# Patient Record
Sex: Male | Born: 1960 | Hispanic: No | Marital: Married | Smoking: Current every day smoker
Health system: Southern US, Community
[De-identification: ages and names within clinical notes are randomized; demographics above are authoritative.]

## PROBLEM LIST (undated history)

## (undated) DIAGNOSIS — E119 Type 2 diabetes mellitus without complications: Secondary | ICD-10-CM

---

## 2016-07-30 ENCOUNTER — Other Ambulatory Visit: Payer: Self-pay | Admitting: Family Medicine

## 2016-07-30 ENCOUNTER — Ambulatory Visit
Admission: RE | Admit: 2016-07-30 | Discharge: 2016-07-30 | Disposition: A | Discharge status: Home / Self Care | Payer: BLUE CROSS/BLUE SHIELD | Source: Ambulatory Visit | Attending: Family Medicine | Admitting: Family Medicine

## 2016-07-30 DIAGNOSIS — R079 Chest pain, unspecified: Secondary | ICD-10-CM

## 2016-08-30 ENCOUNTER — Ambulatory Visit
Admission: RE | Admit: 2016-08-30 | Discharge: 2016-08-30 | Disposition: A | Discharge status: Home / Self Care | Payer: BLUE CROSS/BLUE SHIELD | Source: Ambulatory Visit | Attending: Family Medicine | Admitting: Family Medicine

## 2016-08-30 ENCOUNTER — Other Ambulatory Visit: Payer: Self-pay | Admitting: Family Medicine

## 2016-08-30 DIAGNOSIS — J9811 Atelectasis: Secondary | ICD-10-CM

## 2017-08-07 ENCOUNTER — Ambulatory Visit
Admission: RE | Admit: 2017-08-07 | Discharge: 2017-08-07 | Disposition: A | Discharge status: Home / Self Care | Payer: BLUE CROSS/BLUE SHIELD | Source: Ambulatory Visit | Attending: Family Medicine | Admitting: Family Medicine

## 2017-08-07 ENCOUNTER — Other Ambulatory Visit: Payer: Self-pay | Admitting: Family Medicine

## 2017-08-07 DIAGNOSIS — R05 Cough: Secondary | ICD-10-CM

## 2017-08-07 DIAGNOSIS — R079 Chest pain, unspecified: Secondary | ICD-10-CM

## 2017-08-07 DIAGNOSIS — R059 Cough, unspecified: Secondary | ICD-10-CM

## 2018-11-17 ENCOUNTER — Other Ambulatory Visit: Payer: Self-pay | Admitting: Optometry

## 2018-11-17 ENCOUNTER — Ambulatory Visit
Admission: RE | Admit: 2018-11-17 | Discharge: 2018-11-17 | Disposition: A | Discharge status: Home / Self Care | Payer: BLUE CROSS/BLUE SHIELD | Source: Ambulatory Visit | Attending: Optometry | Admitting: Optometry

## 2018-11-17 DIAGNOSIS — G5131 Clonic hemifacial spasm, right: Secondary | ICD-10-CM

## 2018-11-17 MED ORDER — GADOBENATE DIMEGLUMINE 529 MG/ML IV SOLN
12.0000 mL | Freq: Once | INTRAVENOUS | Status: AC | PRN
  Administered 2018-11-17: 12 mL via INTRAVENOUS

## 2020-03-16 ENCOUNTER — Ambulatory Visit: Payer: Self-pay | Attending: Internal Medicine

## 2020-03-22 ENCOUNTER — Emergency Department (HOSPITAL_COMMUNITY): Payer: 59

## 2020-03-22 ENCOUNTER — Inpatient Hospital Stay (HOSPITAL_COMMUNITY)
Admission: EM | Admit: 2020-03-22 | Discharge: 2020-03-28 | DRG: 177 | Disposition: A | Discharge status: Home / Self Care | Payer: 59 | Attending: Internal Medicine | Admitting: Internal Medicine

## 2020-03-22 ENCOUNTER — Encounter (HOSPITAL_COMMUNITY): Payer: Self-pay

## 2020-03-22 ENCOUNTER — Other Ambulatory Visit: Payer: Self-pay

## 2020-03-22 DIAGNOSIS — Y92230 Patient room in hospital as the place of occurrence of the external cause: Secondary | ICD-10-CM | POA: Diagnosis present

## 2020-03-22 DIAGNOSIS — R945 Abnormal results of liver function studies: Secondary | ICD-10-CM

## 2020-03-22 DIAGNOSIS — J1282 Pneumonia due to Coronavirus disease 2019: Secondary | ICD-10-CM

## 2020-03-22 DIAGNOSIS — T380X5A Adverse effect of glucocorticoids and synthetic analogues, initial encounter: Secondary | ICD-10-CM | POA: Diagnosis present

## 2020-03-22 DIAGNOSIS — R531 Weakness: Secondary | ICD-10-CM | POA: Diagnosis not present

## 2020-03-22 DIAGNOSIS — D539 Nutritional anemia, unspecified: Secondary | ICD-10-CM | POA: Diagnosis present

## 2020-03-22 DIAGNOSIS — E785 Hyperlipidemia, unspecified: Secondary | ICD-10-CM | POA: Diagnosis present

## 2020-03-22 DIAGNOSIS — R5383 Other fatigue: Secondary | ICD-10-CM | POA: Diagnosis not present

## 2020-03-22 DIAGNOSIS — F17200 Nicotine dependence, unspecified, uncomplicated: Secondary | ICD-10-CM | POA: Diagnosis present

## 2020-03-22 DIAGNOSIS — A0839 Other viral enteritis: Secondary | ICD-10-CM | POA: Diagnosis present

## 2020-03-22 DIAGNOSIS — E1169 Type 2 diabetes mellitus with other specified complication: Secondary | ICD-10-CM | POA: Diagnosis present

## 2020-03-22 DIAGNOSIS — Z7984 Long term (current) use of oral hypoglycemic drugs: Secondary | ICD-10-CM

## 2020-03-22 DIAGNOSIS — E878 Other disorders of electrolyte and fluid balance, not elsewhere classified: Secondary | ICD-10-CM

## 2020-03-22 DIAGNOSIS — E1165 Type 2 diabetes mellitus with hyperglycemia: Secondary | ICD-10-CM | POA: Diagnosis present

## 2020-03-22 DIAGNOSIS — R0600 Dyspnea, unspecified: Secondary | ICD-10-CM | POA: Diagnosis not present

## 2020-03-22 DIAGNOSIS — R197 Diarrhea, unspecified: Secondary | ICD-10-CM | POA: Diagnosis not present

## 2020-03-22 DIAGNOSIS — I959 Hypotension, unspecified: Secondary | ICD-10-CM | POA: Diagnosis not present

## 2020-03-22 DIAGNOSIS — Z79899 Other long term (current) drug therapy: Secondary | ICD-10-CM | POA: Diagnosis not present

## 2020-03-22 DIAGNOSIS — R638 Other symptoms and signs concerning food and fluid intake: Secondary | ICD-10-CM

## 2020-03-22 DIAGNOSIS — F1010 Alcohol abuse, uncomplicated: Secondary | ICD-10-CM | POA: Diagnosis present

## 2020-03-22 DIAGNOSIS — J069 Acute upper respiratory infection, unspecified: Secondary | ICD-10-CM | POA: Diagnosis not present

## 2020-03-22 DIAGNOSIS — E871 Hypo-osmolality and hyponatremia: Secondary | ICD-10-CM

## 2020-03-22 DIAGNOSIS — U071 COVID-19: Secondary | ICD-10-CM | POA: Diagnosis not present

## 2020-03-22 DIAGNOSIS — E876 Hypokalemia: Secondary | ICD-10-CM

## 2020-03-22 DIAGNOSIS — R0602 Shortness of breath: Secondary | ICD-10-CM

## 2020-03-22 DIAGNOSIS — E1142 Type 2 diabetes mellitus with diabetic polyneuropathy: Secondary | ICD-10-CM | POA: Diagnosis present

## 2020-03-22 DIAGNOSIS — J9601 Acute respiratory failure with hypoxia: Secondary | ICD-10-CM | POA: Diagnosis present

## 2020-03-22 DIAGNOSIS — R7989 Other specified abnormal findings of blood chemistry: Secondary | ICD-10-CM

## 2020-03-22 HISTORY — DX: Type 2 diabetes mellitus without complications: E11.9

## 2020-03-22 LAB — COMPREHENSIVE METABOLIC PANEL
ALT: 80 U/L — ABNORMAL HIGH (ref 0–44)
AST: 125 U/L — ABNORMAL HIGH (ref 15–41)
Albumin: 3.3 g/dL — ABNORMAL LOW (ref 3.5–5.0)
Alkaline Phosphatase: 70 U/L (ref 38–126)
Anion gap: 10 (ref 5–15)
BUN: 10 mg/dL (ref 6–20)
CO2: 28 mmol/L (ref 22–32)
Calcium: 8.1 mg/dL — ABNORMAL LOW (ref 8.9–10.3)
Chloride: 93 mmol/L — ABNORMAL LOW (ref 98–111)
Creatinine, Ser: 0.62 mg/dL (ref 0.61–1.24)
GFR calc Af Amer: >60 mL/min (ref >60)
GFR calc non Af Amer: >60 mL/min (ref >60)
Glucose, Bld: 230 mg/dL — ABNORMAL HIGH (ref 70–99)
Potassium: 3.3 mmol/L — ABNORMAL LOW (ref 3.5–5.1)
Sodium: 131 mmol/L — ABNORMAL LOW (ref 135–145)
Total Bilirubin: 1.1 mg/dL (ref 0.3–1.2)
Total Protein: 6.5 g/dL (ref 6.5–8.1)

## 2020-03-22 LAB — URINALYSIS, ROUTINE W REFLEX MICROSCOPIC
Bilirubin Urine: NEGATIVE
Glucose, UA: >=500 mg/dL — AB
Hgb urine dipstick: NEGATIVE
Ketones, ur: 80 mg/dL — AB
Leukocytes,Ua: NEGATIVE
Nitrite: NEGATIVE
Protein, ur: 100 mg/dL — AB
Specific Gravity, Urine: 1.014 (ref 1.005–1.030)
pH: 7 (ref 5.0–8.0)

## 2020-03-22 LAB — CBC WITH DIFFERENTIAL/PLATELET
Abs Immature Granulocytes: 0.02 10*3/uL (ref 0.00–0.07)
Basophils Absolute: 0 10*3/uL (ref 0.0–0.1)
Basophils Relative: 0 %
Eosinophils Absolute: 0 10*3/uL (ref 0.0–0.5)
Eosinophils Relative: 1 %
HCT: 35.1 % — ABNORMAL LOW (ref 39.0–52.0)
Hemoglobin: 12.5 g/dL — ABNORMAL LOW (ref 13.0–17.0)
Immature Granulocytes: 1 %
Lymphocytes Relative: 19 %
Lymphs Abs: 0.7 10*3/uL (ref 0.7–4.0)
MCH: 37.9 pg — ABNORMAL HIGH (ref 26.0–34.0)
MCHC: 35.6 g/dL (ref 30.0–36.0)
MCV: 106.4 fL — ABNORMAL HIGH (ref 80.0–100.0)
Monocytes Absolute: 0.6 10*3/uL (ref 0.1–1.0)
Monocytes Relative: 17 %
Neutro Abs: 2.4 10*3/uL (ref 1.7–7.7)
Neutrophils Relative %: 62 %
Platelets: 227 10*3/uL (ref 150–400)
RBC: 3.3 MIL/uL — ABNORMAL LOW (ref 4.22–5.81)
RDW: 11.5 % (ref 11.5–15.5)
WBC: 3.7 10*3/uL — ABNORMAL LOW (ref 4.0–10.5)
nRBC: 0 % (ref 0.0–0.2)

## 2020-03-22 LAB — D-DIMER, QUANTITATIVE: D-Dimer, Quant: 1.22 ug/mL-FEU — ABNORMAL HIGH (ref 0.00–0.50)

## 2020-03-22 LAB — FIBRINOGEN: Fibrinogen: 596 mg/dL — ABNORMAL HIGH (ref 210–475)

## 2020-03-22 LAB — MAGNESIUM: Magnesium: 2 mg/dL (ref 1.7–2.4)

## 2020-03-22 LAB — PROCALCITONIN: Procalcitonin: <0.1 ng/mL

## 2020-03-22 LAB — RESPIRATORY PANEL BY RT PCR (FLU A&B, COVID)
Influenza A by PCR: NEGATIVE
Influenza B by PCR: NEGATIVE
SARS Coronavirus 2 by RT PCR: POSITIVE — AB

## 2020-03-22 LAB — TRIGLYCERIDES: Triglycerides: 145 mg/dL (ref <150)

## 2020-03-22 LAB — LACTIC ACID, PLASMA: Lactic Acid, Venous: 1.5 mmol/L (ref 0.5–1.9)

## 2020-03-22 LAB — TROPONIN I (HIGH SENSITIVITY): Troponin I (High Sensitivity): 3 ng/L (ref <18)

## 2020-03-22 LAB — LACTATE DEHYDROGENASE: LDH: 716 U/L — ABNORMAL HIGH (ref 98–192)

## 2020-03-22 LAB — ETHANOL: Alcohol, Ethyl (B): <10 mg/dL (ref <10)

## 2020-03-22 LAB — C-REACTIVE PROTEIN: CRP: 3.1 mg/dL — ABNORMAL HIGH (ref <1.0)

## 2020-03-22 LAB — FERRITIN: Ferritin: >7500 ng/mL — ABNORMAL HIGH (ref 24–336)

## 2020-03-22 MED ORDER — POTASSIUM CHLORIDE CRYS ER 20 MEQ PO TBCR
40.0000 meq | EXTENDED_RELEASE_TABLET | Freq: Two times a day (BID) | ORAL | Status: AC
  Administered 2020-03-22 – 2020-03-23 (×2): 40 meq via ORAL
  Filled 2020-03-22 (×2): qty 2

## 2020-03-22 MED ORDER — THIAMINE HCL 100 MG PO TABS
100.0000 mg | ORAL_TABLET | Freq: Every day | ORAL | Status: DC
  Administered 2020-03-23 – 2020-03-28 (×6): 100 mg via ORAL
  Filled 2020-03-22 (×6): qty 1

## 2020-03-22 MED ORDER — SODIUM CHLORIDE 0.9 % IV BOLUS
500.0000 mL | Freq: Once | INTRAVENOUS | Status: AC
  Administered 2020-03-22: 500 mL via INTRAVENOUS

## 2020-03-22 MED ORDER — ADULT MULTIVITAMIN W/MINERALS CH
1.0000 | ORAL_TABLET | Freq: Every day | ORAL | Status: DC
  Administered 2020-03-22 – 2020-03-28 (×7): 1 via ORAL
  Filled 2020-03-22 (×7): qty 1

## 2020-03-22 MED ORDER — FOLIC ACID 1 MG PO TABS
1.0000 mg | ORAL_TABLET | Freq: Every day | ORAL | Status: DC
  Administered 2020-03-22 – 2020-03-28 (×7): 1 mg via ORAL
  Filled 2020-03-22 (×7): qty 1

## 2020-03-22 MED ORDER — LORAZEPAM 2 MG/ML IJ SOLN: 0.0000 mg | Freq: Two times a day (BID) | INTRAMUSCULAR | Status: DC

## 2020-03-22 MED ORDER — SODIUM CHLORIDE 0.9 % IV SOLN
100.0000 mg | INTRAVENOUS | Status: AC
  Administered 2020-03-22 (×2): 100 mg via INTRAVENOUS
  Filled 2020-03-22 (×2): qty 20

## 2020-03-22 MED ORDER — IPRATROPIUM-ALBUTEROL 20-100 MCG/ACT IN AERS
1.0000 | INHALATION_SPRAY | Freq: Four times a day (QID) | RESPIRATORY_TRACT | Status: DC
  Administered 2020-03-22 – 2020-03-28 (×23): 1 via RESPIRATORY_TRACT
  Filled 2020-03-22: qty 4

## 2020-03-22 MED ORDER — THIAMINE HCL 100 MG/ML IJ SOLN
100.0000 mg | Freq: Every day | INTRAMUSCULAR | Status: DC
  Administered 2020-03-22: 100 mg via INTRAVENOUS
  Filled 2020-03-22 (×2): qty 2

## 2020-03-22 MED ORDER — LORAZEPAM 1 MG PO TABS: 1.0000 mg | ORAL_TABLET | ORAL | Status: DC | PRN

## 2020-03-22 MED ORDER — LORAZEPAM 2 MG/ML IJ SOLN: 0.0000 mg | Freq: Four times a day (QID) | INTRAMUSCULAR | Status: DC

## 2020-03-22 MED ORDER — SODIUM CHLORIDE 0.9 % IV SOLN
100.0000 mg | Freq: Every day | INTRAVENOUS | Status: AC
  Administered 2020-03-23 – 2020-03-26 (×4): 100 mg via INTRAVENOUS
  Filled 2020-03-22 (×4): qty 20

## 2020-03-22 MED ORDER — DEXAMETHASONE SODIUM PHOSPHATE 10 MG/ML IJ SOLN
10.0000 mg | Freq: Once | INTRAMUSCULAR | Status: AC
  Administered 2020-03-22: 10 mg via INTRAVENOUS
  Filled 2020-03-22: qty 1

## 2020-03-22 MED ORDER — SODIUM CHLORIDE 0.9 % IV SOLN: INTRAVENOUS | Status: DC

## 2020-03-22 MED ORDER — LORAZEPAM 2 MG/ML IJ SOLN: 1.0000 mg | INTRAMUSCULAR | Status: DC | PRN

## 2020-03-22 NOTE — ED Triage Notes (Signed)
Pt states he had Covid + test 8 days ago, c/o worsening SOB and cough with fatigue and loss of appetite.

## 2020-03-22 NOTE — H&P (Signed)
History and Physical    David Garrett PTW:[id number] DOB: Jan 03, 1961 DOA: 03/22/2020  PCP: System, Pcp Not In   Patient coming from: Home  Chief Complaint: Worseneing SOB, Cough, Fatigue, Chest Pain with Coughing and Diarrhea  HPI: David Garrett is a 59 y.o. male with medical history significant for Diabetes Mellitus Type 2 complicated by diabetic polyneuropathy on oral medication, hyperlipidemia on statin, alcohol abuse with daily use, macrocytosis without anemia, as well as tobacco abuse and other comorbidities who presents with a chief complaint of fatigue, worsening shortness of breath and cough along with diarrhea.  Patient states that he is not been febrile as he has been taking Tylenol frequently.  He was diagnosed with COVID-19 disease 8 days ago and was doing okay until he acutely worsened over the last week.  He does have family context of Covid and his special needs daughter is currently hospitalized with COVID-19 disease.  He states that he has been having more frequent diarrhea and he has not had a very good appetite given that he recently had 7 teeth removed.  He has not had a very good appetite and states that he is just not been doing well since then and has gotten progressively worse.  ED Course: The ED he had basic blood work done and inflammatory markers were checked.  A chest x-ray was done and an EKG was done as well.  He was given 10 mg of IV Solu-Medrol blood cultures were also checked.  Repeat Covid testing is pending however he states he test + 8 days ago.  Review of Systems: As per HPI otherwise all other systems reviewed and negative.   Past Medical History:  Diagnosis Date  . Diabetes mellitus without complication ([facility])    SURICAL HISTORY  History reviewed.Has recently had several Teeth removed by Dr. Nonah Mattes for Chronic Apical Abscesses and Fillings  SOCIAL HISTORY   reports that he has been smoking. He has never used smokeless tobacco.  Patient drinks 4 cans of beer  daily  ALLERGIES No Known Allergies  FAMILY HISTORY  History reviewed.  His sister had lung cancer  Prior to Admission medications   Medication Sig Start Date End Date Taking? Authorizing Provider  chlorhexidine (PERIDEX) 0.12 % solution 15 mLs by Mouth Rinse route 2 (two) times daily. 02/23/20   [provider]  metFORMIN (GLUCOPHAGE-XR) 500 MG 24 hr tablet Take 1,000 mg by mouth every evening. 03/12/20   [provider]   Physical Exam: Vitals:   03/22/20 1715 03/22/20 1730 03/22/20 1745 03/22/20 1800  BP:  118/83  110/65  Pulse: 91 (!) 102 86 86  Resp: (!) 25   20  Temp:      TempSrc:      SpO2: 100% 92% 91% 91%   Constitutional: WN/WD male currently in mild respiratory distress appears mildly uncomfortable Eyes: Lids and conjunctivae normal, sclerae anicteric  ENMT: External Ears, Nose appear normal. Grossly normal hearing. Poor Dentition  Neck: Appears normal, supple, no cervical masses, normal ROM, no appreciable thyromegaly; no JVD Respiratory: Diminished to auscultation bilaterally with coarse breath sounds and some mild rhonchi; No appreciable wheezing, rales, crackles. Normal respiratory effort and patient is not tachypenic. No accessory muscle use. Wearing 2 liters of supplemental O2 via [place] Cardiovascular: RRR, no murmurs / rubs / gallops. S1 and S2 auscultated. No extremity edema. Abdomen: Soft, non-tender, non-distended. Bowel sounds positive.  GU: Deferred. Musculoskeletal: No clubbing / cyanosis of digits/nails. No joint deformity upper and lower extremities.  Skin: No rashes, lesions, ulcers on a limited skin evaluation. No induration; Warm and dry.  Neurologic: CN 2-12 grossly intact with no focal deficits. Romberg sign cerebellar reflexes not assessed.  Psychiatric: Normal judgment and insight. Alert and oriented x 3. Normal mood and appropriate affect.   Labs on Admission: I have personally reviewed following labs and imaging  studies  CBC: Recent Labs  Lab 03/22/20 1504  WBC 3.7*  NEUTROABS 2.4  HGB 12.5*  HCT 35.1*  MCV 106.4*  PLT 563   Basic Metabolic Panel: Recent Labs  Lab 03/22/20 1504  NA 131*  K 3.3*  CL 93*  CO2 28  GLUCOSE 230*  BUN 10  CREATININE 0.62  CALCIUM 8.1*   GFR: CrCl cannot be calculated (Unknown ideal weight.). Liver Function Tests: Recent Labs  Lab 03/22/20 1504  AST 125*  ALT 80*  ALKPHOS 70  BILITOT 1.1  PROT 6.5  ALBUMIN 3.3*   No results for input(s): LIPASE, AMYLASE in the last 168 hours. No results for input(s): AMMONIA in the last 168 hours. Coagulation Profile: No results for input(s): INR, PROTIME in the last 168 hours. Cardiac Enzymes: No results for input(s): CKTOTAL, CKMB, CKMBINDEX, TROPONINI in the last 168 hours. BNP (last 3 results) No results for input(s): PROBNP in the last 8760 hours. HbA1C: No results for input(s): HGBA1C in the last 72 hours. CBG: No results for input(s): GLUCAP in the last 168 hours. Lipid Profile: Recent Labs    03/22/20 1504  TRIG 145   Thyroid Function Tests: No results for input(s): TSH, T4TOTAL, FREET4, T3FREE, THYROIDAB in the last 72 hours. Anemia Panel: Recent Labs    03/22/20 1504  FERRITIN >7,500*   Urine analysis: No results found for: COLORURINE, APPEARANCEUR, LABSPEC, PHURINE, GLUCOSEU, HGBUR, BILIRUBINUR, KETONESUR, PROTEINUR, UROBILINOGEN, NITRITE, LEUKOCYTESUR Sepsis Labs: !!!!!!!!!!!!!!!!!!!!!!!!!!!!!!!!!!!!!!!!!!!! '@LABRCNTIP'$ (procalcitonin:4,lacticidven:4) )No results found for this or any previous visit (from the past 240 hour(s)).   Radiological Exams on Admission: DG Chest Port 1 View  Result Date: 03/22/2020 CLINICAL DATA:  Cough and possible positive COVID-19 test EXAM: PORTABLE CHEST 1 VIEW COMPARISON:  None. FINDINGS: Cardiac shadows within normal limits. Mild aortic calcifications are seen. The lungs are well aerated bilaterally with patchy bibasilar opacities. Some less  prominent opacities are noted particularly in the left upper lobe. No bony abnormality is noted. IMPRESSION: Patchy bilateral opacities predominately in the bases. Correlate with COVID-19 testing as this could represent atypical pneumonia. Electronically Signed   By: Inez Catalina M.D.   On: 03/22/2020 15:09    EKG: Independently reviewed.  Sinus rhythm with a rate of 85 and a QTC of 442 with no evidence of ST elevation on my interpretation  Assessment/Plan Active Problems:   Pneumonia due to COVID-19 virus   Gastroenteritis due to COVID-19 virus   Acute respiratory disease due to COVID-19 virus   Diarrhea   Hyponatremia   Hypochloremia   Hypokalemia   Abnormal LFTs   Macrocytic anemia   Type 2 diabetes mellitus with hyperlipidemia (HCC)   Decreased oral intake   Generalized weakness   Fatigue   Pneumonia secondary to COVID-19 disease present on admission Acute respiratory Disease secondary to COVID-19 disease Gastroenteritis secondary to COVID-19 disease -Patient presents with fatigue, worsening cough and shortness of breath along with some diarrhea is progressively gotten worse -He has had a poor p.o. intake secondary to his fatigue and generalized not feeling well and he has been having diarrhea as well -Patient states that he tested +8 days ago -Recently  had 7 teeth taken out for abscesses -Been around sick contacts that have COVID-19 disease and his daughter is actually hospitalized here with COVID-19 disease as well -Chest X-ray showed "Cardiac shadows within normal limits. Mild aortic calcifications are seen. The lungs are well aerated bilaterally with patchy bibasilar opacities. Some less prominent opacities are noted particularly in  the left upper lobe. No bony abnormality is noted." -Check baseline inflammatory markers and trend daily: Currently ESR still pending to be done: Recent Labs    03/22/20 1504  DDIMER 1.22*  FERRITIN >7,500*  LDH 716*  CRP 3.1*  -Fibrinogen  was 596 triglycerides were normal  No results found for: SARSCOV2NAA -SpO2: 91 % -Wearing 2 L of supplemental oxygen via nasal cannula due to has some respiratory distress and mildly low oxygen saturations in the low 90s -Start the patient on a remdesivir course for 5 days and also start the patient on dexamethasone 6 mg daily for 10 days -Encourage proning as much as possible -Continue supplemental oxygen via nasal cannula and wean O2 as tolerated -Continuous pulse oximetry and maintain O2 saturations greater than 90% -Start the patient on Combivent scheduled and albuterol inhaler as needed every 4h -Continue with antitussives with Tussionex and Robitussin-DM -Procalcitonin level assessment 0.10 and lactic acid level is 1.5-check blood cultures x200 pending -Continue with vitamin C and zinc -Airborne and droplet precautions -Repeat chest x-ray in the a.m. -We will need an ambulatory home O2 screen prior to discharge -Continue to monitor respiratory status carefully  Diarrhea -In the setting of above -Continue gentle IV fluid hydration for 1 day -Continue to monitor and continue with supportive care  Hyponatremia/hypochloremia -In the setting of poor p.o. intake and dehydration -Given a bolus of 500 mL in the ED and will start normal saline at 75 MLS per hour for 1 day and stop in accordance to not volume overload patient -Continue to monitor and trend repeat CMP in a.m.  Hypokalemia -Patient's potassium was 3.3 and likely in the setting of diarrhea  -check magnesium level -Replete with p.o. potassium chloride 40 mEq twice daily x2 doses -Continue monitor and replete as necessary -Repeat CMP in a.m.  Abnormal LFTs -Likely in the setting of his COVID-19 disease but he does have a history of alcohol abuse -AST is 125 on admission ALT was 80 next- -because ALT is not greater than 220 we can start with remdesivir -Continue to monitor and trend his LFTs and if not improving or  worsening will obtain a right upper quadrant ultrasound as well as acute hepatitis panel -Repeat CMP in a.m.  Macrocytic Anemia -In setting of alcohol abuse -Patient's hemoglobin/hematocrit was 12.5/35.1 and he carries a diagnosis of macrocytosis without anemia -Check anemia panel in the a.m. Continue to monitor for signs and symptoms of bleeding; currently no overt bleeding noted -Repeat CBC in a.m.  Alcohol abuse with concern for withdrawal -Initiate CIWA scale and withdrawal protocol with lorazepam -Continue folic acid, multivitamin, vitamin B12 -Continue to monitor for signs and symptoms of withdrawal patient drinks beer daily  Diabetes Mellitus Type II  -takes oral medications which will be held  -we will place on sensitive NovoLog sliding scale insulin before meals and at bedtime and will place the patient on a diabetic diet -Continue monitor and trend blood sugars and check hemoglobin A1c in the a.m.  Poor p.o. intake -In the setting of his teeth being removed as well as COVID-19 disease Consult nutritionist for further evaluation recommendations Continue with gentle IV fluid  hydration with normal saline today given his poor p.o. intake and placed on a carb modified diet  Generalized Weakness and fatigue  -in the setting of above -Check PT OT -Check TSH  -Continue to treat Covid as above  HLD -Hold Statin given elevated LFTs  DVT prophylaxis: Enoxaparin 40 mg subcu every 24 Code Status: FULL CODE Family Communication: No family present at bedside as patient's wife is with his daughter upstairs on [address] Disposition Plan: Pending further clinical improvement and treatment of his COVID-19 disease Consults called: None Admission status: Inpatient telemetry  Severity of Illness: The appropriate patient status for this patient is INPATIENT. Inpatient status is judged to be reasonable and necessary in order to provide the required intensity of service to ensure the patient's  safety. The patient's presenting symptoms, physical exam findings, and initial radiographic and laboratory data in the context of their chronic comorbidities is felt to place them at high risk for further clinical deterioration. Furthermore, it is not anticipated that the patient will be medically stable for discharge from the hospital within 2 midnights of admission. The following factors support the patient status of inpatient.   " The patient's presenting symptoms include cough, fatigue, worsening shortness of breath, diarrhea and poor po intake. " The worrisome physical exam findings include diminished lung sounds, cough and slightly dry mucous membranes . " The initial radiographic and laboratory data are worrisome because of Patchy bilateral Opacities and Elevated Inflammatory Markers . " The chronic co-morbidities include Diabetes, HLD, Alcohol Abuse, Dental Abscesses  * I certify that at the point of admission it is my clinical judgment that the patient will require inpatient hospital care spanning beyond 2 midnights from the point of admission due to high intensity of service, high risk for further deterioration and high frequency of surveillance required.Kerney Elbe, D.O. [facility] is on [place]  If 7PM-7AM, please contact night-coverage [email or web address]  03/22/2020, 6:07 PM

## 2020-03-22 NOTE — ED Provider Notes (Signed)
Care assumed from Dr. Rodena Medin at shift change.  Patient is a 59 year old male diagnosed with COVID-19 8 days ago.  He presents today with worsening fatigue, cough, and shortness of breath.  Care signed out to me awaiting results of his laboratory studies and determination of the disposition.  Laboratory studies show elevations of his inflammatory markers.  Chest x-ray shows bilateral multifocal infiltrates.  Patient remains hypoxic with oxygen saturations in the low 90s.  His oxygen saturations improved on 2 L nasal cannula to the upper 90s.  I feel as though patient will require admission for IV remdesivir and steroids.  I spoken with Dr. Marland Mcalpine who agrees to admit.   Geoffery Lyons, MD 03/22/20 1730

## 2020-03-22 NOTE — ED Provider Notes (Signed)
[facility]-EMERGENCY DEPT Provider Note   CSN: [id number] Arrival date & time: 03/22/20  1302     History Chief Complaint  Patient presents with  . Covid +  . Shortness of Breath  . Cough  . Fatigue    Rylyn Ranganathan is a 59 y.o. male.  59 year old male with prior medical history as detailed below presents for evaluation of worsening symptoms related to Covid infection.  Patient reports that he had a positive Covid test 8 days prior.  Patient reports worsening shortness of breath with associated cough and fevers.  Patient is also complaining of significant diarrhea.  He denies vomiting.  He reports feeling weaker.  He denies current chest pain.  Patient reports that his Covid test was performed by nurse who came to his house.  The history is provided by the patient and medical records. No language interpreter was used.  Illness Location:  Covid positive, cough, weakness, diarrhea  Severity:  Mild Onset quality:  Gradual Duration:  8 days Timing:  Constant Progression:  Worsening Chronicity:  New Associated symptoms: congestion, cough, fatigue, fever and shortness of breath        Past Medical History:  Diagnosis Date  . Diabetes mellitus without complication ([facility])     There are no problems to display for this patient.   History reviewed. No pertinent surgical history.     History reviewed. No pertinent family history.  Social History   Tobacco Use  . Smoking status: Current Every Day Smoker  . Smokeless tobacco: Never Used  Substance Use Topics  . Alcohol use: Not on file  . Drug use: Not on file    Home Medications Prior to Admission medications   Not on File    Allergies    Patient has no known allergies.  Review of Systems   Review of Systems  Constitutional: Positive for fatigue and fever.  HENT: Positive for congestion.   Respiratory: Positive for cough and shortness of breath.   All other systems reviewed and are  negative.   Physical Exam Updated Vital Signs BP 119/74   Pulse 99   Temp 98.1 F (36.7 C) (Oral)   Resp 20   SpO2 92%   Physical Exam Vitals and nursing note reviewed.  Constitutional:      General: He is not in acute distress.    Appearance: He is well-developed.  HENT:     Head: Normocephalic and atraumatic.  Eyes:     Conjunctiva/sclera: Conjunctivae normal.     Pupils: Pupils are equal, round, and reactive to light.  Cardiovascular:     Rate and Rhythm: Normal rate and regular rhythm.     Heart sounds: Normal heart sounds.  Pulmonary:     Effort: Pulmonary effort is normal. No respiratory distress.     Breath sounds: Normal breath sounds.  Abdominal:     General: There is no distension.     Palpations: Abdomen is soft.     Tenderness: There is no abdominal tenderness.  Musculoskeletal:        General: No deformity. Normal range of motion.     Cervical back: Normal range of motion and neck supple.  Skin:    General: Skin is warm and dry.  Neurological:     General: No focal deficit present.     Mental Status: He is alert and oriented to person, place, and time.     ED Results / Procedures / Treatments   Labs (all labs ordered  are listed, but only abnormal results are displayed) Labs Reviewed  RESPIRATORY PANEL BY RT PCR (FLU A&B, COVID)  CULTURE, BLOOD (ROUTINE X 2)  CULTURE, BLOOD (ROUTINE X 2)  CBC WITH DIFFERENTIAL/PLATELET  COMPREHENSIVE METABOLIC PANEL  ETHANOL  LACTIC ACID, PLASMA  LACTIC ACID, PLASMA  URINALYSIS, ROUTINE W REFLEX MICROSCOPIC  TROPONIN I (HIGH SENSITIVITY)    EKG None  Radiology No results found.  Procedures Procedures (including critical care time)  Medications Ordered in ED Medications  sodium chloride 0.9 % bolus 500 mL (has no administration in time range)    ED Course  I have reviewed the triage vital signs and the nursing notes.  Pertinent labs & imaging results that were available during my care of the  patient were reviewed by me and considered in my medical decision making (see chart for details).    MDM Rules/Calculators/A&P                      MDM  Screen complete  Johnryan Sao was evaluated in Emergency Department on 03/22/2020 for the symptoms described in the history of present illness. He was evaluated in the context of the global COVID-19 pandemic, which necessitated consideration that the patient might be at risk for infection with the SARS-CoV-2 virus that causes COVID-19. Institutional protocols and algorithms that pertain to the evaluation of patients at risk for COVID-19 are in a state of rapid change based on information released by regulatory bodies including the CDC and federal and state organizations. These policies and algorithms were followed during the patient's care in the ED.  Patient is presenting for evaluation of worsening cough, shortness of breath, fevers, malaise, and weakness.  Patient is reporting positive Covid test approximately 8 days prior.  Patient's constellation of symptoms is consistent with worsening Covid infection and possible Covid pneumonia.  Patient is mildly hypoxic with an oxygen sat in the 90 to 92% range on room air.  Initial screening labs ordered to fully evaluate the patient's symptoms.  Repeat Covid testing pending.  Patient is not in significant respiratory distress.  Supplemental O2 provided.  Patient will likely benefit from admission for further workup and treatment.  Pending labs and disposition signed out to Dr. Stark Jock.      Final Clinical Impression(s) / ED Diagnoses Final diagnoses:  Dyspnea, unspecified type    Rx / DC Orders ED Discharge Orders    None       Valarie Merino, MD 03/22/20 1444

## 2020-03-22 NOTE — ED Notes (Signed)
Pt called out requesting restroom assistance.  Pt advised that he needed to have a BM.  Provided pt with a bedside commode.  Also advised pt that we need a urine sample when he is able.  Pt understood.

## 2020-03-23 ENCOUNTER — Inpatient Hospital Stay (HOSPITAL_COMMUNITY): Payer: 59

## 2020-03-23 LAB — COMPREHENSIVE METABOLIC PANEL
ALT: 87 U/L — ABNORMAL HIGH (ref 0–44)
AST: 120 U/L — ABNORMAL HIGH (ref 15–41)
Albumin: 3 g/dL — ABNORMAL LOW (ref 3.5–5.0)
Alkaline Phosphatase: 69 U/L (ref 38–126)
Anion gap: 8 (ref 5–15)
BUN: 9 mg/dL (ref 6–20)
CO2: 27 mmol/L (ref 22–32)
Calcium: 8.1 mg/dL — ABNORMAL LOW (ref 8.9–10.3)
Chloride: 102 mmol/L (ref 98–111)
Creatinine, Ser: 0.46 mg/dL — ABNORMAL LOW (ref 0.61–1.24)
GFR calc Af Amer: >60 mL/min (ref >60)
GFR calc non Af Amer: >60 mL/min (ref >60)
Glucose, Bld: 176 mg/dL — ABNORMAL HIGH (ref 70–99)
Potassium: 3.7 mmol/L (ref 3.5–5.1)
Sodium: 137 mmol/L (ref 135–145)
Total Bilirubin: 1.4 mg/dL — ABNORMAL HIGH (ref 0.3–1.2)
Total Protein: 6.2 g/dL — ABNORMAL LOW (ref 6.5–8.1)

## 2020-03-23 LAB — CBC WITH DIFFERENTIAL/PLATELET
Abs Immature Granulocytes: 0.02 10*3/uL (ref 0.00–0.07)
Basophils Absolute: 0 10*3/uL (ref 0.0–0.1)
Basophils Relative: 0 %
Eosinophils Absolute: 0 10*3/uL (ref 0.0–0.5)
Eosinophils Relative: 1 %
HCT: 33 % — ABNORMAL LOW (ref 39.0–52.0)
Hemoglobin: 11.9 g/dL — ABNORMAL LOW (ref 13.0–17.0)
Immature Granulocytes: 1 %
Lymphocytes Relative: 27 %
Lymphs Abs: 1 10*3/uL (ref 0.7–4.0)
MCH: 38.8 pg — ABNORMAL HIGH (ref 26.0–34.0)
MCHC: 36.1 g/dL — ABNORMAL HIGH (ref 30.0–36.0)
MCV: 107.5 fL — ABNORMAL HIGH (ref 80.0–100.0)
Monocytes Absolute: 0.6 10*3/uL (ref 0.1–1.0)
Monocytes Relative: 16 %
Neutro Abs: 2.1 10*3/uL (ref 1.7–7.7)
Neutrophils Relative %: 55 %
Platelets: 217 10*3/uL (ref 150–400)
RBC: 3.07 MIL/uL — ABNORMAL LOW (ref 4.22–5.81)
RDW: 11.6 % (ref 11.5–15.5)
WBC: 3.8 10*3/uL — ABNORMAL LOW (ref 4.0–10.5)
nRBC: 0 % (ref 0.0–0.2)

## 2020-03-23 LAB — HIV ANTIBODY (ROUTINE TESTING W REFLEX): HIV Screen 4th Generation wRfx: NONREACTIVE

## 2020-03-23 LAB — SEDIMENTATION RATE: Sed Rate: 72 mm/hr — ABNORMAL HIGH (ref 0–16)

## 2020-03-23 LAB — PHOSPHORUS: Phosphorus: 2.8 mg/dL (ref 2.5–4.6)

## 2020-03-23 LAB — HEMOGLOBIN A1C
Hgb A1c MFr Bld: 7.6 % — ABNORMAL HIGH (ref 4.8–5.6)
Mean Plasma Glucose: 171.42 mg/dL

## 2020-03-23 LAB — D-DIMER, QUANTITATIVE: D-Dimer, Quant: 0.98 ug{FEU}/mL — ABNORMAL HIGH (ref 0.00–0.50)

## 2020-03-23 LAB — GLUCOSE, CAPILLARY
Glucose-Capillary: 149 mg/dL — ABNORMAL HIGH (ref 70–99)
Glucose-Capillary: 187 mg/dL — ABNORMAL HIGH (ref 70–99)
Glucose-Capillary: 237 mg/dL — ABNORMAL HIGH (ref 70–99)

## 2020-03-23 LAB — TROPONIN I (HIGH SENSITIVITY): Troponin I (High Sensitivity): 4 ng/L (ref <18)

## 2020-03-23 LAB — C-REACTIVE PROTEIN: CRP: 3.9 mg/dL — ABNORMAL HIGH

## 2020-03-23 LAB — MAGNESIUM: Magnesium: 2 mg/dL (ref 1.7–2.4)

## 2020-03-23 LAB — ABO/RH: ABO/RH(D): A POS

## 2020-03-23 LAB — FERRITIN: Ferritin: >7500 ng/mL — ABNORMAL HIGH (ref 24–336)

## 2020-03-23 MED ORDER — ENOXAPARIN SODIUM 40 MG/0.4ML ~~LOC~~ SOLN
40.0000 mg | Freq: Every day | SUBCUTANEOUS | Status: DC
  Administered 2020-03-23 – 2020-03-26 (×4): 40 mg via SUBCUTANEOUS
  Filled 2020-03-23 (×4): qty 0.4

## 2020-03-23 MED ORDER — ZINC SULFATE 220 (50 ZN) MG PO CAPS
220.0000 mg | ORAL_CAPSULE | Freq: Every day | ORAL | Status: DC
  Administered 2020-03-23 – 2020-03-28 (×6): 220 mg via ORAL
  Filled 2020-03-23 (×6): qty 1

## 2020-03-23 MED ORDER — ONDANSETRON HCL 4 MG PO TABS: 4.0000 mg | ORAL_TABLET | Freq: Four times a day (QID) | ORAL | Status: DC | PRN

## 2020-03-23 MED ORDER — GUAIFENESIN-DM 100-10 MG/5ML PO SYRP: 10.0000 mL | ORAL_SOLUTION | ORAL | Status: DC | PRN

## 2020-03-23 MED ORDER — ASCORBIC ACID 500 MG PO TABS
500.0000 mg | ORAL_TABLET | Freq: Every day | ORAL | Status: DC
  Administered 2020-03-23 – 2020-03-28 (×6): 500 mg via ORAL
  Filled 2020-03-23 (×6): qty 1

## 2020-03-23 MED ORDER — IPRATROPIUM-ALBUTEROL 20-100 MCG/ACT IN AERS: 1.0000 | INHALATION_SPRAY | Freq: Four times a day (QID) | RESPIRATORY_TRACT | Status: DC

## 2020-03-23 MED ORDER — INSULIN ASPART 100 UNIT/ML ~~LOC~~ SOLN
0.0000 [IU] | Freq: Three times a day (TID) | SUBCUTANEOUS | Status: DC
  Administered 2020-03-24 (×2): 3 [IU] via SUBCUTANEOUS
  Administered 2020-03-24: 5 [IU] via SUBCUTANEOUS
  Administered 2020-03-25 (×3): 2 [IU] via SUBCUTANEOUS
  Administered 2020-03-26: 3 [IU] via SUBCUTANEOUS
  Administered 2020-03-26 – 2020-03-27 (×3): 2 [IU] via SUBCUTANEOUS
  Administered 2020-03-27 – 2020-03-28 (×2): 3 [IU] via SUBCUTANEOUS
  Administered 2020-03-28: 7 [IU] via SUBCUTANEOUS

## 2020-03-23 MED ORDER — TRAMADOL HCL 50 MG PO TABS: 50.0000 mg | ORAL_TABLET | Freq: Four times a day (QID) | ORAL | Status: DC | PRN

## 2020-03-23 MED ORDER — DEXAMETHASONE SODIUM PHOSPHATE 10 MG/ML IJ SOLN
6.0000 mg | INTRAMUSCULAR | Status: DC
  Administered 2020-03-23 – 2020-03-27 (×5): 6 mg via INTRAVENOUS
  Filled 2020-03-23 (×5): qty 1

## 2020-03-23 MED ORDER — ONDANSETRON HCL 4 MG/2ML IJ SOLN: 4.0000 mg | Freq: Four times a day (QID) | INTRAMUSCULAR | Status: DC | PRN

## 2020-03-23 MED ORDER — INSULIN ASPART 100 UNIT/ML ~~LOC~~ SOLN
0.0000 [IU] | Freq: Every day | SUBCUTANEOUS | Status: DC
  Administered 2020-03-23: 2 [IU] via SUBCUTANEOUS
  Administered 2020-03-24: 4 [IU] via SUBCUTANEOUS
  Administered 2020-03-27: 2 [IU] via SUBCUTANEOUS

## 2020-03-23 MED ORDER — ACETAMINOPHEN 325 MG PO TABS: 650.0000 mg | ORAL_TABLET | Freq: Four times a day (QID) | ORAL | Status: DC | PRN

## 2020-03-23 MED ORDER — HYDROCOD POLST-CPM POLST ER 10-8 MG/5ML PO SUER
5.0000 mL | Freq: Two times a day (BID) | ORAL | Status: DC | PRN
  Administered 2020-03-23 – 2020-03-28 (×4): 5 mL via ORAL
  Filled 2020-03-23 (×4): qty 5

## 2020-03-23 NOTE — Progress Notes (Signed)
PROGRESS NOTE    David Garrett    Code Status: Full Code  UMP:[id number] DOB: 1961/03/26 DOA: 03/22/2020 LOS: 1 days  PCP: System, Pcp Not In CC:  Chief Complaint  Patient presents with  . Covid +  . Shortness of Breath  . Cough  . Fatigue       Hospital Summary   This is a 59 year old male with a history of DM2 diabetic polyneuropathy, hyperlipidemia, alcohol abuse with daily use, macrocytosis, tobacco abuse who presented with chief complaint of fatigue, worsening shortness of breath and cough with diarrhea and recent COVID-19 positive contact with family.  Found to be COVID-19 positive and started on therapy.  A & P   Active Problems:   Pneumonia due to COVID-19 virus   Gastroenteritis due to COVID-19 virus   Acute respiratory disease due to COVID-19 virus   Diarrhea   Hyponatremia   Hypochloremia   Hypokalemia   Abnormal LFTs   Macrocytic anemia   Type 2 diabetes mellitus with hyperlipidemia (HCC)   Decreased oral intake   Generalized weakness   Fatigue   1. Acute hypoxic respiratory failure secondary to COVID-19 pneumonia without respiratory distress a. D-dimer 0.98 b. CXR from today with minimally worsened left and similar right-sided COVID-19 pneumonia findings c. Symptomatically improved, continues to require supplemental O2 d. Day 2/5 remdesivir, day 2/10 dexamethasone e. Continue incentive spirometry f. Encourage pronation as tolerated g. Continue Combivent and vitamins  2. COVID-19 induced gastroenteritis/diarrhea, improving  3. Hyponatremia/hypochloremia, resolved with IV fluids a. Discontinue further IV fluids to prevent volume overload  4. Hypokalemia likely from diarrhea, resolved  5. Abnormal LFTs, likely elevated from COVID-19 a. Continue to monitor  6. Macrocytic anemia in setting of alcohol abuse, stable  7. Alcohol use a. Alcohol level negative, patient reported last drink was 10 days ago b. Continue CIWA protocol with as needed  Ativan c. Continue vitamins  8. Type 2 diabetes a. Continue sliding scale insulin and diabetic diet b. Check A1c  9. Poor p.o. intake in setting of recent tooth extractions and COVID-19 disease a. Follow-up with nutritionist recommendations b. Discontinue further IV fluids  10. Generalized weakness and fatigue a. No further PT or OT needed at discharge  11. Hyperlipidemia a. Holding statin due to elevated LFTs  DVT prophylaxis: Lovenox Family Communication: Patient's wife has been updated  Disposition Plan:  Status is: Inpatient  Remains inpatient appropriate because:Inpatient level of care appropriate due to severity of illness   Dispo: The patient is from: Home              Anticipated d/c is to: Home              Anticipated d/c date is: 2 days              Patient currently is not medically stable to d/c.           Pressure injury documentation    None  Consultants  None  Procedures  None  Antibiotics   Anti-infectives (From admission, onward)   Start     Dose/Rate Route Frequency Ordered Stop   03/23/20 1000  remdesivir 100 mg in sodium chloride 0.9 % 100 mL IVPB     100 mg 200 mL/hr over 30 Minutes Intravenous Daily 03/22/20 1656 03/27/20 0959   03/22/20 1700  remdesivir 100 mg in sodium chloride 0.9 % 100 mL IVPB     100 mg 200 mL/hr over 30 Minutes Intravenous Every 1 hr  x 2 03/22/20 1656 03/22/20 2241        Subjective   Patient seen and examined at bedside in his daughter's room next door as patient's daughter is also hospitalized with COVID-19.  His wife is here taking care of the daughter as the daughter has developmental delay.  Daughter, patient and wife seen together.  He is in no acute distress and resting comfortably on nasal cannula. No acute events overnight. Denies any acute complaints at this time. Ambulating with some exertional dyspnea. Tolerating diet well.   Objective   Vitals:   03/23/20 0100 03/23/20 0236 03/23/20 0554  03/23/20 1334  BP: 111/68 107/68 115/75 104/90  Pulse: 79 83 86   Resp: (!) 21 (!) 22 20   Temp:  98.2 F (36.8 C) 98.4 F (36.9 C) 98 F (36.7 C)  TempSrc:  Oral Oral Oral  SpO2: 100% 98% 97%   Weight:  57.6 kg    Height:  5\' 8"  (1.727 m)      Intake/Output Summary (Last 24 hours) at 03/23/2020 1459 Last data filed at 03/23/2020 0846 Gross per 24 hour  Intake 1620 ml  Output 2 ml  Net 1618 ml   Filed Weights   03/23/20 0236  Weight: 57.6 kg    Examination:  Physical Exam Vitals and nursing note reviewed.  Constitutional:      Appearance: Normal appearance.  HENT:     Head: Normocephalic and atraumatic.  Eyes:     Conjunctiva/sclera: Conjunctivae normal.  Cardiovascular:     Rate and Rhythm: Normal rate and regular rhythm.  Pulmonary:     Effort: Pulmonary effort is normal. No tachypnea.  Abdominal:     General: Abdomen is flat.     Palpations: Abdomen is soft.  Musculoskeletal:        General: No swelling or tenderness.  Skin:    Coloration: Skin is not jaundiced or pale.  Neurological:     Mental Status: He is alert. Mental status is at baseline.  Psychiatric:        Mood and Affect: Mood normal.        Behavior: Behavior normal.     Data Reviewed: I have personally reviewed following labs and imaging studies  CBC: Recent Labs  Lab 03/22/20 1504 03/23/20 0407  WBC 3.7* 3.8*  NEUTROABS 2.4 2.1  HGB 12.5* 11.9*  HCT 35.1* 33.0*  MCV 106.4* 107.5*  PLT 227 217   Basic Metabolic Panel: Recent Labs  Lab 03/22/20 1504 03/23/20 0407  NA 131* 137  K 3.3* 3.7  CL 93* 102  CO2 28 27  GLUCOSE 230* 176*  BUN 10 9  CREATININE 0.62 0.46*  CALCIUM 8.1* 8.1*  MG 2.0 2.0  PHOS  --  2.8   GFR: Estimated Creatinine Clearance: 81 mL/min (A) (by C-G formula based on SCr of 0.46 mg/dL (L)). Liver Function Tests: Recent Labs  Lab 03/22/20 1504 03/23/20 0407  AST 125* 120*  ALT 80* 87*  ALKPHOS 70 69  BILITOT 1.1 1.4*  PROT 6.5 6.2*  ALBUMIN  3.3* 3.0*   No results for input(s): LIPASE, AMYLASE in the last 168 hours. No results for input(s): AMMONIA in the last 168 hours. Coagulation Profile: No results for input(s): INR, PROTIME in the last 168 hours. Cardiac Enzymes: No results for input(s): CKTOTAL, CKMB, CKMBINDEX, TROPONINI in the last 168 hours. BNP (last 3 results) No results for input(s): PROBNP in the last 8760 hours. HbA1C: No results for input(s): HGBA1C in the  last 72 hours. CBG: Recent Labs  Lab 03/23/20 0747 03/23/20 1151  GLUCAP 149* 187*   Lipid Profile: Recent Labs    03/22/20 1504  TRIG 145   Thyroid Function Tests: No results for input(s): TSH, T4TOTAL, FREET4, T3FREE, THYROIDAB in the last 72 hours. Anemia Panel: Recent Labs    03/22/20 1504 03/23/20 0407  FERRITIN >7,500* >7,500*   Sepsis Labs: Recent Labs  Lab 03/22/20 1504  PROCALCITON <0.10  LATICACIDVEN 1.5    Recent Results (from the past 240 hour(s))  Respiratory Panel by RT PCR (Flu A&B, Covid) - Nasopharyngeal Swab     Status: Abnormal   Collection Time: 03/22/20  2:55 PM   Specimen: Nasopharyngeal Swab  Result Value Ref Range Status   SARS Coronavirus 2 by RT PCR POSITIVE (A) NEGATIVE Final    Comment: RESULT CALLED TO, READ BACK BY AND VERIFIED WITH: C.JACKSON AT 2004 ON 03/22/20 BY N.THOMPSON (NOTE) SARS-CoV-2 target nucleic acids are DETECTED. SARS-CoV-2 RNA is generally detectable in upper respiratory specimens  during the acute phase of infection. Positive results are indicative of the presence of the identified virus, but do not rule out bacterial infection or co-infection with other pathogens not detected by the test. Clinical correlation with patient history and other diagnostic information is necessary to determine patient infection status. The expected result is Negative. Fact Sheet for Patients:  https://www.moore.com/ Fact Sheet for Healthcare  Providers: https://www.young.biz/ This test is not yet approved or cleared by the [place] FDA and  has been authorized for detection and/or diagnosis of SARS-CoV-2 by FDA under an Emergency Use Authorization (EUA).  This EUA will remain in effect (meaning this test can be  used) for the duration of  the COVID-19 declaration under Section 564(b)(1) of the Act, 21 U.S.C. section 360bbb-3(b)(1), unless the authorization is terminated or revoked sooner.    Influenza A by PCR NEGATIVE NEGATIVE Final   Influenza B by PCR NEGATIVE NEGATIVE Final    Comment: (NOTE) The Xpert Xpress SARS-CoV-2/FLU/RSV assay is intended as an aid in  the diagnosis of influenza from Nasopharyngeal swab specimens and  should not be used as a sole basis for treatment. Nasal washings and  aspirates are unacceptable for Xpert Xpress SARS-CoV-2/FLU/RSV  testing. Fact Sheet for Patients: https://www.moore.com/ Fact Sheet for Healthcare Providers: https://www.young.biz/ This test is not yet approved or cleared by the [place] FDA and  has been authorized for detection and/or diagnosis of SARS-CoV-2 by  FDA under an Emergency Use Authorization (EUA). This EUA will remain  in effect (meaning this test can be used) for the duration of the  Covid-19 declaration under Section 564(b)(1) of the Act, 21  U.S.C. section 360bbb-3(b)(1), unless the authorization is  terminated or revoked. Performed at [facility], [address] [place], [place] [address]   Culture, blood (routine x 2)     Status: None (Preliminary result)   Collection Time: 03/22/20  3:04 PM   Specimen: BLOOD  Result Value Ref Range Status   Specimen Description BLOOD LEFT ANTECUBITAL  Final   Special Requests   Final    BOTTLES DRAWN AEROBIC AND ANAEROBIC Blood Culture results may not be optimal due to an excessive volume of blood received in culture  bottles Performed at [facility], [address], [place], [place] [address]    Culture NO GROWTH < 24 HOURS  Final   Report Status PENDING  Incomplete  Culture, blood (routine x 2)     Status:  None (Preliminary result)   Collection Time: 03/22/20  3:19 PM   Specimen: BLOOD  Result Value Ref Range Status   Specimen Description BLOOD RIGHT ANTECUBITAL  Final   Special Requests   Final    BOTTLES DRAWN AEROBIC AND ANAEROBIC Blood Culture results may not be optimal due to an excessive volume of blood received in culture bottles Performed at [facility] [address], [place], [place] [address]    Culture NO GROWTH < 24 HOURS  Final   Report Status PENDING  Incomplete         Radiology Studies: Portable chest 1 View  Result Date: 03/23/2020 CLINICAL DATA:  Shortness of breath.  COVID-19 pneumonia. EXAM: PORTABLE CHEST 1 VIEW COMPARISON:  1 day prior FINDINGS: Patient rotated minimally left. Midline trachea. Normal heart size. Left greater than right peripheral and basilar predominant interstitial opacities again identified. Similar on the left and slightly increased on the right. No pneumothorax or other acute complication. No free intraperitoneal air. IMPRESSION: Minimally worsened left and similar right-sided COVID-19 pneumonia. Electronically Signed   By: Abigail Miyamoto M.D.   On: 03/23/2020 07:37   DG Chest Port 1 View  Result Date: 03/22/2020 CLINICAL DATA:  Cough and possible positive COVID-19 test EXAM: PORTABLE CHEST 1 VIEW COMPARISON:  None. FINDINGS: Cardiac shadows within normal limits. Mild aortic calcifications are seen. The lungs are well aerated bilaterally with patchy bibasilar opacities. Some less prominent opacities are noted particularly in the left upper lobe. No bony abnormality is noted. IMPRESSION: Patchy bilateral opacities predominately in the bases. Correlate with COVID-19 testing as this could represent atypical pneumonia.  Electronically Signed   By: Inez Catalina M.D.   On: 03/22/2020 15:09        Scheduled Meds: . vitamin C  500 mg Oral Daily  . dexamethasone (DECADRON) injection  6 mg Intravenous Q24H  . enoxaparin (LOVENOX) injection  40 mg Subcutaneous Daily  . folic acid  1 mg Oral Daily  . insulin aspart  0-5 Units Subcutaneous QHS  . insulin aspart  0-9 Units Subcutaneous TID WC  . Ipratropium-Albuterol  1 puff Inhalation Q6H  . multivitamin with minerals  1 tablet Oral Daily  . thiamine  100 mg Oral Daily   Or  . thiamine  100 mg Intravenous Daily  . zinc sulfate  220 mg Oral Daily   Continuous Infusions: . sodium chloride 75 mL/hr at 03/23/20 0314  . remdesivir 100 mg in NS 100 mL 100 mg (03/23/20 0852)     Time spent: 27 minutes with over 50% of the time coordinating the patient's care    Harold Hedge, DO [facility] Pager [phone]  Call night coverage person covering after 7pm

## 2020-03-23 NOTE — TOC Progression Note (Signed)
Transition of Care [facility]) - Progression Note    Patient Details  Name: David Garrett MRN: [id number] Date of Birth: [birth year]  Transition of Care [facility]) CM/SW Contact  Geni Bers, RN Phone Number: 03/23/2020, 4:03 PM  Clinical Narrative:     Pt from home and plan to return home.   Expected Discharge Plan: Home/Self Care Barriers to Discharge: No Barriers Identified  Expected Discharge Plan and Services Expected Discharge Plan: Home/Self Care       Living arrangements for the past 2 months: Single Family Home                                       Social Determinants of Health (SDOH) Interventions    Readmission Risk Interventions No flowsheet data found.

## 2020-03-23 NOTE — Evaluation (Signed)
Occupational Therapy Evaluation Patient Details Name: David Garrett MRN: [id number] DOB: 11-28-1961 Today's Date: 03/23/2020    History of Present Illness David Garrett is a 59 y.o. male with medical history significant for Diabetes Mellitus Type 2, hyperlipidemia, alcohol abuse with daily use, as well as tobacco abuse. Pt presents with a chief complaint of fatigue, worsening shortness of breath and cough along with diarrhea.  Pt reports he was diagnosed with COVID-19 disease 8 days ago and was doing okay until he acutely worsened over the last week.    Clinical Impression   Patient is a 59 year old male that lives with family, daughter currently admitted with COVID-19. Patient reports independent at baseline. Patient is close to baseline functioning, supervision for safety with line management and cues for standing rest break due to high respiration rate 41, O2 between 86-90% on 4L. Patient would benefit from further acute OT services for education regarding energy conservation and pursed lip breathing techniques to maximize safety and independence at home.    Follow Up Recommendations  No OT follow up    Equipment Recommendations  None recommended by OT       Precautions / Restrictions Precautions Precautions: Other (comment) Precaution Comments: monitor sats Restrictions Weight Bearing Restrictions: No      Mobility Bed Mobility Overal bed mobility: Modified Independent             General bed mobility comments: HOB elevated  Transfers Overall transfer level: Needs assistance Equipment used: None Transfers: Sit to/from Stand Sit to Stand: Supervision         General transfer comment: for safety with management of lines    Balance Overall balance assessment: No apparent balance deficits (not formally assessed)                                         ADL either performed or assessed with clinical judgement   ADL Overall ADL's : Needs  assistance/impaired Eating/Feeding: Independent   Grooming: Supervision/safety;Standing   Upper Body Bathing: Sitting;Set up   Lower Body Bathing: Supervison/ safety;Sit to/from stand   Upper Body Dressing : Set up;Sitting   Lower Body Dressing: Supervision/safety;Sit to/from stand Lower Body Dressing Details (indicate cue type and reason): patient able to pull up socks seated edge of bed  Toilet Transfer: Supervision/safety;Ambulation Toilet Transfer Details (indicate cue type and reason): supervision for safety with line management Toileting- Clothing Manipulation and Hygiene: Independent       Functional mobility during ADLs: Supervision/safety;Rolling walker General ADL Comments: patient supervision level for safety with managing lines, main impairment is activity tolerance/fatigue and O2 needs with O2 reading 86-90% on 4L, RR between 26-41. cues for pursed lip breathing                  Pertinent Vitals/Pain Pain Assessment: No/denies pain     Hand Dominance (not specified)   Extremity/Trunk Assessment Upper Extremity Assessment Upper Extremity Assessment: Overall WFL for tasks assessed   Lower Extremity Assessment Lower Extremity Assessment: Defer to PT evaluation   Cervical / Trunk Assessment Cervical / Trunk Assessment: Normal   Communication Communication Communication: No difficulties   Cognition Arousal/Alertness: Awake/alert Behavior During Therapy: WFL for tasks assessed/performed Overall Cognitive Status: Within Functional Limits for tasks assessed  Home Living Family/patient expects to be discharged to:: Private residence Living Arrangements: Spouse/significant other Available Help at Discharge: Family Type of Home: Apartment Home Access: Stairs to enter [employer] of [employer]: lives on second floor   Home Layout: One level                          Prior  Functioning/Environment Level of Independence: Independent                 OT Problem List: Decreased activity tolerance      OT Treatment/Interventions: Self-care/ADL training;Therapeutic exercise;Energy conservation;DME and/or AE instruction;Patient/family education;Therapeutic activities    OT Goals(Current goals can be found in the care plan section) Acute Rehab OT Goals Patient Stated Goal: to rest OT Goal Formulation: With patient Time For Goal Achievement: 04/06/20 Potential to Achieve Goals: Good  OT Frequency: Min 2X/week           Co-evaluation PT/OT/SLP Co-Evaluation/Treatment: Yes Reason for Co-Treatment: To address functional/ADL transfers;Complexity of the patient's impairments (multi-system involvement)   OT goals addressed during session: ADL's and self-care      AM-PAC OT "6 Clicks" Daily Activity     Outcome Measure Help from another person eating meals?: None Help from another person taking care of personal grooming?: A Little Help from another person toileting, which includes using toliet, bedpan, or urinal?: A Little Help from another person bathing (including washing, rinsing, drying)?: A Little Help from another person to put on and taking off regular upper body clothing?: A Little Help from another person to put on and taking off regular lower body clothing?: A Little 6 Click Score: 19   End of Session Equipment Utilized During Treatment: Oxygen  Activity Tolerance: Patient tolerated treatment well;Patient limited by fatigue Patient left: in bed;with call bell/phone within reach  OT Visit Diagnosis: Other abnormalities of gait and mobility (R26.89)                Time: 2831-5176 OT Time Calculation (min): 12 min Charges:  OT General Charges $OT Visit: 1 Visit OT Evaluation $OT Eval Low Complexity: 1 Low  Marlyce Huge OT Pager: [phone]  Carmelia Roller 03/23/2020, 3:07 PM

## 2020-03-23 NOTE — Evaluation (Signed)
Physical Therapy Evaluation Patient Details Name: David Garrett MRN: [id number] DOB: 1961-01-08 Today's Date: 03/23/2020   History of Present Illness  David Garrett is a 59 y.o. male with medical history significant for Diabetes Mellitus Type 2, hyperlipidemia, alcohol abuse with daily use, as well as tobacco abuse. Pt presents with a chief complaint of fatigue, worsening shortness of breath and cough along with diarrhea.  Pt reports he was diagnosed with COVID-19 disease 8 days ago and was doing okay until he acutely worsened over the last week.   Clinical Impression  Pt admitted with COVID-19. Pt currently with functional limitations due to the deficits listed below (see PT Problem List). Pt will benefit from skilled PT to increase their independence and safety with mobility to allow discharge to the venue listed below.  Pt independent at baseline and lives with spouse and daughter (currently in room beside pt).  Pt able to ambulate 200 feet with SpO2 reading 86-90% on 4L, RR between 26-41.  Pt reports breathing status improved and did not reports dyspnea during activity.  Anticipate pt to progress well and d/c home with no PT needs.     Follow Up Recommendations No PT follow up    Equipment Recommendations  None recommended by PT    Recommendations for Other Services       Precautions / Restrictions Precautions Precautions: Other (comment) Precaution Comments: monitor sats Restrictions Weight Bearing Restrictions: No      Mobility  Bed Mobility Overal bed mobility: Modified Independent             General bed mobility comments: HOB elevated  Transfers Overall transfer level: Needs assistance Equipment used: None Transfers: Sit to/from Stand Sit to Stand: Supervision         General transfer comment: for safety with management of lines  Ambulation/Gait Ambulation/Gait assistance: Min guard;Supervision Gait Distance (Feet): 200 Feet Assistive device: IV Pole Gait  Pattern/deviations: Step-through pattern;Decreased stride length     General Gait Details: pt pushed equipment however not required for support; encouraged rest breaks and pursed lip breathing; SpO2 reading 86-90% on 4L, RR between 26-41.  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 Home Layout: One level        Prior Function Level of Independence: Independent               Hand Dominance   Dominant Hand: (not specified)    Extremity/Trunk Assessment   Upper Extremity Assessment Upper Extremity Assessment: Overall WFL for tasks assessed    Lower Extremity Assessment Lower Extremity Assessment: Overall WFL for tasks assessed    Cervical / Trunk Assessment Cervical / Trunk Assessment: Normal  Communication   Communication: No difficulties  Cognition Arousal/Alertness: Awake/alert Behavior During Therapy: WFL for tasks assessed/performed Overall Cognitive Status: Within Functional Limits for  tasks assessed                                        General Comments      Exercises     Assessment/Plan    PT Assessment Patient needs continued PT services  PT Problem List Decreased mobility;Decreased activity tolerance;Decreased knowledge of use of DME;Cardiopulmonary status limiting activity       PT Treatment Interventions DME instruction;Therapeutic activities;Gait training;Therapeutic exercise;Functional mobility  training;Patient/family education    PT Goals (Current goals can be found in the Care Plan section)  Acute Rehab PT Goals Patient Stated Goal: to rest PT Goal Formulation: With patient Time For Goal Achievement: 03/30/20 Potential to Achieve Goals: Good    Frequency Min 3X/week   Barriers to discharge        Co-evaluation PT/OT/SLP Co-Evaluation/Treatment: Yes Reason for Co-Treatment: To address functional/ADL transfers;Complexity of the patient's impairments (multi-system involvement) PT goals addressed during session: Mobility/safety with mobility OT goals addressed during session: ADL's and self-care       AM-PAC PT "6 Clicks" Mobility  Outcome Measure Help needed turning from your back to your side while in a flat bed without using bedrails?: None Help needed moving from lying on your back to sitting on the side of a flat bed without using bedrails?: None Help needed moving to and from a bed to a chair (including a wheelchair)?: None Help needed standing up from a chair using your arms (e.g., wheelchair or bedside chair)?: A Little Help needed to walk in hospital room?: A Little Help needed climbing 3-5 steps with a railing? : A Little 6 Click Score: 21    End of Session Equipment Utilized During Treatment: Oxygen Activity Tolerance: Patient tolerated treatment well Patient left: in bed;with call bell/phone within reach   PT Visit Diagnosis: Difficulty in walking, not elsewhere classified (R26.2)    Time: 1017-5102 PT Time Calculation (min) (ACUTE ONLY): 12 min   Charges:   PT Evaluation $PT Eval Low Complexity: 1 Low     Kati PT, DPT Acute Rehabilitation Services Office: [phone]   Trena Platt 03/23/2020, 3:41 PM

## 2020-03-24 LAB — COMPREHENSIVE METABOLIC PANEL
ALT: 126 U/L — ABNORMAL HIGH (ref 0–44)
AST: 126 U/L — ABNORMAL HIGH (ref 15–41)
Albumin: 3 g/dL — ABNORMAL LOW (ref 3.5–5.0)
Alkaline Phosphatase: 77 U/L (ref 38–126)
Anion gap: 12 (ref 5–15)
BUN: 12 mg/dL (ref 6–20)
CO2: 24 mmol/L (ref 22–32)
Calcium: 8.5 mg/dL — ABNORMAL LOW (ref 8.9–10.3)
Chloride: 100 mmol/L (ref 98–111)
Creatinine, Ser: 0.45 mg/dL — ABNORMAL LOW (ref 0.61–1.24)
GFR calc Af Amer: >60 mL/min (ref >60)
GFR calc non Af Amer: >60 mL/min (ref >60)
Glucose, Bld: 218 mg/dL — ABNORMAL HIGH (ref 70–99)
Potassium: 4.1 mmol/L (ref 3.5–5.1)
Sodium: 136 mmol/L (ref 135–145)
Total Bilirubin: 1.2 mg/dL (ref 0.3–1.2)
Total Protein: 6.5 g/dL (ref 6.5–8.1)

## 2020-03-24 LAB — CBC WITH DIFFERENTIAL/PLATELET
Abs Immature Granulocytes: 0.02 10*3/uL (ref 0.00–0.07)
Basophils Absolute: 0 10*3/uL (ref 0.0–0.1)
Basophils Relative: 0 %
Eosinophils Absolute: 0 10*3/uL (ref 0.0–0.5)
Eosinophils Relative: 0 %
HCT: 34 % — ABNORMAL LOW (ref 39.0–52.0)
Hemoglobin: 11.9 g/dL — ABNORMAL LOW (ref 13.0–17.0)
Immature Granulocytes: 1 %
Lymphocytes Relative: 22 %
Lymphs Abs: 0.6 10*3/uL — ABNORMAL LOW (ref 0.7–4.0)
MCH: 37.9 pg — ABNORMAL HIGH (ref 26.0–34.0)
MCHC: 35 g/dL (ref 30.0–36.0)
MCV: 108.3 fL — ABNORMAL HIGH (ref 80.0–100.0)
Monocytes Absolute: 0.5 10*3/uL (ref 0.1–1.0)
Monocytes Relative: 19 %
Neutro Abs: 1.6 10*3/uL — ABNORMAL LOW (ref 1.7–7.7)
Neutrophils Relative %: 58 %
Platelets: 271 10*3/uL (ref 150–400)
RBC: 3.14 MIL/uL — ABNORMAL LOW (ref 4.22–5.81)
RDW: 11.6 % (ref 11.5–15.5)
WBC: 2.8 10*3/uL — ABNORMAL LOW (ref 4.0–10.5)
nRBC: 0 % (ref 0.0–0.2)

## 2020-03-24 LAB — GLUCOSE, CAPILLARY
Glucose-Capillary: 205 mg/dL — ABNORMAL HIGH (ref 70–99)
Glucose-Capillary: 250 mg/dL — ABNORMAL HIGH (ref 70–99)
Glucose-Capillary: 254 mg/dL — ABNORMAL HIGH (ref 70–99)
Glucose-Capillary: 307 mg/dL — ABNORMAL HIGH (ref 70–99)

## 2020-03-24 LAB — PHOSPHORUS: Phosphorus: 3.6 mg/dL (ref 2.5–4.6)

## 2020-03-24 LAB — SEDIMENTATION RATE: Sed Rate: 80 mm/hr — ABNORMAL HIGH (ref 0–16)

## 2020-03-24 LAB — FERRITIN: Ferritin: 5979 ng/mL — ABNORMAL HIGH (ref 24–336)

## 2020-03-24 LAB — MAGNESIUM: Magnesium: 2.2 mg/dL (ref 1.7–2.4)

## 2020-03-24 LAB — C-REACTIVE PROTEIN: CRP: 7 mg/dL — ABNORMAL HIGH (ref <1.0)

## 2020-03-24 LAB — D-DIMER, QUANTITATIVE: D-Dimer, Quant: 1.01 ug/mL-FEU — ABNORMAL HIGH (ref 0.00–0.50)

## 2020-03-24 LAB — VITAMIN B12: Vitamin B-12: 1914 pg/mL — ABNORMAL HIGH (ref 180–914)

## 2020-03-24 MED ORDER — PRO-STAT SUGAR FREE PO LIQD
30.0000 mL | Freq: Three times a day (TID) | ORAL | Status: DC
  Administered 2020-03-24 – 2020-03-28 (×9): 30 mL via ORAL
  Filled 2020-03-24 (×9): qty 30

## 2020-03-24 MED ORDER — INSULIN ASPART 100 UNIT/ML ~~LOC~~ SOLN
2.0000 [IU] | Freq: Three times a day (TID) | SUBCUTANEOUS | Status: DC
  Administered 2020-03-24 – 2020-03-25 (×2): 2 [IU] via SUBCUTANEOUS

## 2020-03-24 MED ORDER — KATE FARMS STANDARD 1.4 PO LIQD
325.0000 mL | ORAL | Status: DC
  Administered 2020-03-24 – 2020-03-28 (×3): 325 mL via ORAL
  Filled 2020-03-24 (×5): qty 325

## 2020-03-24 MED ORDER — LIDOCAINE 5 % EX PTCH
1.0000 | MEDICATED_PATCH | CUTANEOUS | Status: DC
  Administered 2020-03-24 – 2020-03-28 (×5): 1 via TRANSDERMAL
  Filled 2020-03-24 (×5): qty 1

## 2020-03-24 MED ORDER — INSULIN DETEMIR 100 UNIT/ML ~~LOC~~ SOLN
4.0000 [IU] | Freq: Two times a day (BID) | SUBCUTANEOUS | Status: DC
  Administered 2020-03-24 – 2020-03-28 (×8): 4 [IU] via SUBCUTANEOUS
  Filled 2020-03-24 (×10): qty 0.04

## 2020-03-24 NOTE — Progress Notes (Signed)
Inpatient Diabetes Program Recommendations  AACE/ADA: New Consensus Statement on Inpatient Glycemic Control (2015)  Target Ranges:  Prepandial:   less than 140 mg/dL      Peak postprandial:   less than 180 mg/dL (1-2 hours)      Critically ill patients:  140 - 180 mg/dL   Lab Results  Component Value Date   GLUCAP 250 (H) 03/24/2020   HGBA1C 7.6 (H) 03/23/2020    Review of Glycemic Control  Diabetes history: DM2 Outpatient Diabetes medications: None Current orders for Inpatient glycemic control: Novolog 0-9 units tidwc and hs  HgbA1C - 7.6% - good glycemic control On Decadron 6 mg QD  Inpatient Diabetes Program Recommendations:     Levemir 4 units bid Novolog 2-3 units tidwc for meal coverage insulin if pt eats > 50%meal  Will follow closely.  Thank you. Ailene Ards, RD, LDN, CDE Inpatient Diabetes Coordinator [phone]

## 2020-03-24 NOTE — Progress Notes (Signed)
Initial Nutrition Assessment  RD working remotely.   DOCUMENTATION CODES:   Not applicable  INTERVENTION:  - will order 30 mL Prostat BID, each supplement provides 100 kcal and 15 grams of protein. - will order [employer] once/day, each supplement provides 455 kcal and 20 grams protein.  - continue to encourage PO intakes.    NUTRITION DIAGNOSIS:   Increased nutrient needs related to acute illness, catabolic illness(COVID-19 infection) as evidenced by estimated needs.  GOAL:   Patient will meet greater than or equal to 90% of their needs  MONITOR:   PO intake, Supplement acceptance, Labs, Weight trends  REASON FOR ASSESSMENT:   Malnutrition Screening Tool  ASSESSMENT:   59 year-old male with a history of type 2 DM, diabetic polyneuropathy, hyperlipidemia, alcohol abuse with daily use, macrocytosis, and tobacco abuse. She presented to the ED with chief complaint of fatigue, worsening SOB, and cough with diarrhea and recent COVID-19 positive contact with family. Found to be COVID-19 positive and started on therapy.  Per chart review, he ate 50% of breakfast and 100% of dinner yesterday (total of 875 kcal and 42 grams protein). Weight yesterday was 127 lb. Weight on 08/27/19 at [facility] was 136 lb. This indicates 9 lb weight loss (6.6% body weight) in the past 7 months; not significant for time frame but unsure if weight loss has occurred more acutely.   Per notes: - acute hypoxic respiratory failure 2/2 COVID-19 PNA without respiratory distress - COVID-induced gastroenteritis and diarrhea--improving - macrocytic anemia - alcohol abuse--CIWA protocol - recent tooth extractions affecting PO intakes - generalized weakness and fatigue  Labs reviewed; CBGs: 205 mg/dl, creatinine: 8.41 mg/dl, Ca: 8.5 mg/dl, LFTs elevated. Medications reviewed; 500 mg ascorbic acid/day, 1 mg folvite/day, sliding scale novolog, daily multivitamin with minerals, 100 mg IV remdesivir x1 dose/day  (4/14-4/17), 100 mg thiamine/day, 220 mg zinc sulfate/day.    NUTRITION - FOCUSED PHYSICAL EXAM:  unable to complete at this time.   Diet Order:   Diet Order            Diet Carb Modified Fluid consistency: Thin; Room service appropriate? Yes  Diet effective now              EDUCATION NEEDS:   No education needs have been identified at this time  Skin:  Skin Assessment: Reviewed RN Assessment  Last BM:  4/15  Height:   Ht Readings from Last 1 Encounters:  03/23/20 5\' 8"  (1.727 m)    Weight:   Wt Readings from Last 1 Encounters:  03/23/20 57.6 kg    Ideal Body Weight:  63.6 kg  BMI:  Body mass index is 19.31 kg/m.  Estimated Nutritional Needs:   Kcal:  1730-2015 kcal  Protein:  85-100 grams  Fluid:  >/= 2 L/day     03/25/20, MS, RD, LDN, CNSC Inpatient Clinical Dietitian RD pager # available in [facility]  After hours/weekend pager # available in [facility]

## 2020-03-24 NOTE — Progress Notes (Signed)
PROGRESS NOTE    David Garrett    Code Status: Full Code  ZYS:[id number] DOB: 1961/01/25 DOA: 03/22/2020 LOS: 2 days  PCP: System, Pcp Not In CC:  Chief Complaint  Patient presents with  . Covid +  . Shortness of Breath  . Cough  . Fatigue       Hospital Summary   This is a 59 year old male with a history of DM2 diabetic polyneuropathy, hyperlipidemia, alcohol abuse with daily use, macrocytosis, tobacco abuse who presented with chief complaint of fatigue, worsening shortness of breath and cough with diarrhea and recent COVID-19 positive contact with family.  Found to be COVID-19 positive and started on therapy.  A & P   Active Problems:   Pneumonia due to COVID-19 virus   Gastroenteritis due to COVID-19 virus   Acute respiratory disease due to COVID-19 virus   Diarrhea   Hyponatremia   Hypochloremia   Hypokalemia   Abnormal LFTs   Macrocytic anemia   Type 2 diabetes mellitus with hyperlipidemia (HCC)   Decreased oral intake   Generalized weakness   Fatigue   1. Acute hypoxic respiratory failure secondary to COVID-19 pneumonia without respiratory distress a. Still on 2-3L [place], Symptomatically improving b. D-dimer 0.98>1.01 c. Day 3/5 remdesivir, day 3/10 dexamethasone d. CRP increased from 3.9->7.0 e. Will hold Actemra for now with increasing LFTs f. Continue incentive spirometry g. Encourage pronation as tolerated h. Continue Combivent and vitamins  2. Musculoskeletal Chest pain, possibly costochondritis induced by cough a. lidoderm patch b. Robitussin DM c. Can consider one time dose of toradol if needed  3. COVID-19 induced gastroenteritis/diarrhea, improving  4. Hyponatremia/hypochloremia, resolved with IV fluids  5. Hypokalemia likely from diarrhea, resolved  6. Abnormal LFTs, likely elevated from COVID-19 a. AST stable at 126 but ALT increased from 87->126 b. Continue to monitor, consider RUQ [place] if it continues to increase and/or fevers  7. Macrocytic anemia  in setting of alcohol abuse a. Vitamin B12 level  8. Alcohol use a. Alcohol level negative, patient reported last drink was 10 days ago b. Discontinue CIWA protocol  9. Type 2 diabetes a. HA1c: 7.6 b. Add levemir 4 u bid and Novolog 2 units TID with meals and continue sliding scale c. Diabetic coordinator on board  10. Poor p.o. intake in setting of recent tooth extractions and COVID-19 disease a. Follow-up with nutritionist recommendations b. Discontinue further IV fluids  11. Generalized weakness and fatigue a. No further PT or OT needed at discharge  12. Hyperlipidemia a. Holding statin due to elevated LFTs  DVT prophylaxis: Lovenox Family Communication: Patient's wife has been updated  Disposition Plan:  Status is: Inpatient  Remains inpatient appropriate because:IV treatments appropriate due to intensity of illness or inability to take PO and Inpatient level of care appropriate due to severity of illness   Dispo: The patient is from: Home              Anticipated d/c is to: Home              Anticipated d/c date is: 3 days              Patient currently is not medically stable to d/c.             Pressure injury documentation    None  Consultants  None  Procedures  None  Antibiotics   Anti-infectives (From admission, onward)   Start     Dose/Rate Route Frequency Ordered Stop  03/23/20 1000  remdesivir 100 mg in sodium chloride 0.9 % 100 mL IVPB     100 mg 200 mL/hr over 30 Minutes Intravenous Daily 03/22/20 1656 03/27/20 0959   03/22/20 1700  remdesivir 100 mg in sodium chloride 0.9 % 100 mL IVPB     100 mg 200 mL/hr over 30 Minutes Intravenous Every 1 hr x 2 03/22/20 1656 03/22/20 2241        Subjective   Reports persistent left sided chest wall pain, most notable with cough. Reports his symptoms are overall improving. Reports less diarrhea, now with only 1 BM last night as opposed to persistent. No other complaints, no overnight  events.  Objective   Vitals:   03/23/20 2100 03/24/20 0418 03/24/20 0524 03/24/20 1157  BP:  107/66 105/74 112/79  Pulse:  77 76 80  Resp:   18 (!) 24  Temp:  98.1 F (36.7 C) 98.6 F (37 C) 98 F (36.7 C)  TempSrc:  Oral Oral Oral  SpO2: (!) 86% 94% 98% 90%  Weight:      Height:        Intake/Output Summary (Last 24 hours) at 03/24/2020 1356 Last data filed at 03/24/2020 1300 Gross per 24 hour  Intake 1510.31 ml  Output 1 ml  Net 1509.31 ml   Filed Weights   03/23/20 0236  Weight: 57.6 kg    Examination:  Physical Exam Vitals and nursing note reviewed.  Constitutional:      Appearance: Normal appearance.  HENT:     Head: Normocephalic and atraumatic.  Eyes:     Conjunctiva/sclera: Conjunctivae normal.  Cardiovascular:     Rate and Rhythm: Normal rate and regular rhythm.  Pulmonary:     Effort: Pulmonary effort is normal. No tachypnea.  Chest:     Comments: Reproducible left chest wall pain Abdominal:     General: Abdomen is flat.     Palpations: Abdomen is soft.  Musculoskeletal:        General: No swelling or tenderness.     Right lower leg: No edema.     Left lower leg: No edema.  Skin:    Coloration: Skin is not jaundiced or pale.  Neurological:     Mental Status: He is alert. Mental status is at baseline.  Psychiatric:        Mood and Affect: Mood normal.        Behavior: Behavior normal.     Data Reviewed: I have personally reviewed following labs and imaging studies  CBC: Recent Labs  Lab 03/22/20 1504 03/23/20 0407 03/24/20 0353  WBC 3.7* 3.8* 2.8*  NEUTROABS 2.4 2.1 1.6*  HGB 12.5* 11.9* 11.9*  HCT 35.1* 33.0* 34.0*  MCV 106.4* 107.5* 108.3*  PLT 227 217 271   Basic Metabolic Panel: Recent Labs  Lab 03/22/20 1504 03/23/20 0407 03/24/20 0353  NA 131* 137 136  K 3.3* 3.7 4.1  CL 93* 102 100  CO2 28 27 24   GLUCOSE 230* 176* 218*  BUN 10 9 12   CREATININE 0.62 0.46* 0.45*  CALCIUM 8.1* 8.1* 8.5*  MG 2.0 2.0 2.2  PHOS  --   2.8 3.6   GFR: Estimated Creatinine Clearance: 81 mL/min (A) (by C-G formula based on SCr of 0.45 mg/dL (L)). Liver Function Tests: Recent Labs  Lab 03/22/20 1504 03/23/20 0407 03/24/20 0353  AST 125* 120* 126*  ALT 80* 87* 126*  ALKPHOS 70 69 77  BILITOT 1.1 1.4* 1.2  PROT 6.5 6.2* 6.5  ALBUMIN  3.3* 3.0* 3.0*   No results for input(s): LIPASE, AMYLASE in the last 168 hours. No results for input(s): AMMONIA in the last 168 hours. Coagulation Profile: No results for input(s): INR, PROTIME in the last 168 hours. Cardiac Enzymes: No results for input(s): CKTOTAL, CKMB, CKMBINDEX, TROPONINI in the last 168 hours. BNP (last 3 results) No results for input(s): PROBNP in the last 8760 hours. HbA1C: Recent Labs    03/23/20 0407  HGBA1C 7.6*   CBG: Recent Labs  Lab 03/23/20 0747 03/23/20 1151 03/23/20 2033 03/24/20 0735 03/24/20 1154  GLUCAP 149* 187* 237* 205* 250*   Lipid Profile: Recent Labs    03/22/20 1504  TRIG 145   Thyroid Function Tests: No results for input(s): TSH, T4TOTAL, FREET4, T3FREE, THYROIDAB in the last 72 hours. Anemia Panel: Recent Labs    03/23/20 0407 03/24/20 0353  FERRITIN >7,500* 5,979*   Sepsis Labs: Recent Labs  Lab 03/22/20 1504  PROCALCITON <0.10  LATICACIDVEN 1.5    Recent Results (from the past 240 hour(s))  Respiratory Panel by RT PCR (Flu A&B, Covid) - Nasopharyngeal Swab     Status: Abnormal   Collection Time: 03/22/20  2:55 PM   Specimen: Nasopharyngeal Swab  Result Value Ref Range Status   SARS Coronavirus 2 by RT PCR POSITIVE (A) NEGATIVE Final    Comment: RESULT CALLED TO, READ BACK BY AND VERIFIED WITH: C.JACKSON AT 2004 ON 03/22/20 BY N.THOMPSON (NOTE) SARS-CoV-2 target nucleic acids are DETECTED. SARS-CoV-2 RNA is generally detectable in upper respiratory specimens  during the acute phase of infection. Positive results are indicative of the presence of the identified virus, but do not rule out bacterial  infection or co-infection with other pathogens not detected by the test. Clinical correlation with patient history and other diagnostic information is necessary to determine patient infection status. The expected result is Negative. Fact Sheet for Patients:  PinkCheek.be Fact Sheet for Healthcare Providers: GravelBags.it This test is not yet approved or cleared by the [place] FDA and  has been authorized for detection and/or diagnosis of SARS-CoV-2 by FDA under an Emergency Use Authorization (EUA).  This EUA will remain in effect (meaning this test can be  used) for the duration of  the COVID-19 declaration under Section 564(b)(1) of the Act, 21 U.S.C. section 360bbb-3(b)(1), unless the authorization is terminated or revoked sooner.    Influenza A by PCR NEGATIVE NEGATIVE Final   Influenza B by PCR NEGATIVE NEGATIVE Final    Comment: (NOTE) The Xpert Xpress SARS-CoV-2/FLU/RSV assay is intended as an aid in  the diagnosis of influenza from Nasopharyngeal swab specimens and  should not be used as a sole basis for treatment. Nasal washings and  aspirates are unacceptable for Xpert Xpress SARS-CoV-2/FLU/RSV  testing. Fact Sheet for Patients: PinkCheek.be Fact Sheet for Healthcare Providers: GravelBags.it This test is not yet approved or cleared by the [place] FDA and  has been authorized for detection and/or diagnosis of SARS-CoV-2 by  FDA under an Emergency Use Authorization (EUA). This EUA will remain  in effect (meaning this test can be used) for the duration of the  Covid-19 declaration under Section 564(b)(1) of the Act, 21  U.S.C. section 360bbb-3(b)(1), unless the authorization is  terminated or revoked. Performed at [facility], [place] [address], [place], [place] [address]   Culture, blood (routine x 2)     Status: None (Preliminary  result)   Collection Time: 03/22/20  3:04 PM   Specimen: BLOOD  Result Value  Ref Range Status   Specimen Description BLOOD LEFT ANTECUBITAL  Final   Special Requests   Final    BOTTLES DRAWN AEROBIC AND ANAEROBIC Blood Culture results may not be optimal due to an excessive volume of blood received in culture bottles Performed at [facility], [address], [place], [place] [address]    Culture NO GROWTH < 24 HOURS  Final   Report Status PENDING  Incomplete  Culture, blood (routine x 2)     Status: None (Preliminary result)   Collection Time: 03/22/20  3:19 PM   Specimen: BLOOD  Result Value Ref Range Status   Specimen Description BLOOD RIGHT ANTECUBITAL  Final   Special Requests   Final    BOTTLES DRAWN AEROBIC AND ANAEROBIC Blood Culture results may not be optimal due to an excessive volume of blood received in culture bottles Performed at [facility], [address], [place], [place] [address]    Culture NO GROWTH < 24 HOURS  Final   Report Status PENDING  Incomplete         Radiology Studies: Portable chest 1 View  Result Date: 03/23/2020 CLINICAL DATA:  Shortness of breath.  COVID-19 pneumonia. EXAM: PORTABLE CHEST 1 VIEW COMPARISON:  1 day prior FINDINGS: Patient rotated minimally left. Midline trachea. Normal heart size. Left greater than right peripheral and basilar predominant interstitial opacities again identified. Similar on the left and slightly increased on the right. No pneumothorax or other acute complication. No free intraperitoneal air. IMPRESSION: Minimally worsened left and similar right-sided COVID-19 pneumonia. Electronically Signed   By: Jeronimo Greaves M.D.   On: 03/23/2020 07:37   DG Chest Port 1 View  Result Date: 03/22/2020 CLINICAL DATA:  Cough and possible positive COVID-19 test EXAM: PORTABLE CHEST 1 VIEW COMPARISON:  None. FINDINGS: Cardiac shadows within normal limits. Mild aortic calcifications are seen. The  lungs are well aerated bilaterally with patchy bibasilar opacities. Some less prominent opacities are noted particularly in the left upper lobe. No bony abnormality is noted. IMPRESSION: Patchy bilateral opacities predominately in the bases. Correlate with COVID-19 testing as this could represent atypical pneumonia. Electronically Signed   By: Alcide Clever M.D.   On: 03/22/2020 15:09        Scheduled Meds: . vitamin C  500 mg Oral Daily  . dexamethasone (DECADRON) injection  6 mg Intravenous Q24H  . enoxaparin (LOVENOX) injection  40 mg Subcutaneous Daily  . feeding supplement ([employer] STANDARD 1.4)  325 mL Oral Q24H  . feeding supplement (PRO-STAT SUGAR FREE 64)  30 mL Oral TID BM  . folic acid  1 mg Oral Daily  . insulin aspart  0-5 Units Subcutaneous QHS  . insulin aspart  0-9 Units Subcutaneous TID WC  . Ipratropium-Albuterol  1 puff Inhalation Q6H  . lidocaine  1 patch Transdermal Q24H  . multivitamin with minerals  1 tablet Oral Daily  . thiamine  100 mg Oral Daily   Or  . thiamine  100 mg Intravenous Daily  . zinc sulfate  220 mg Oral Daily   Continuous Infusions: . remdesivir 100 mg in NS 100 mL 100 mg (03/24/20 1058)     Time spent: 25 minutes with over 50% of the time coordinating the patient's care    Jae Dire, DO [facility] Pager [phone]  Call night coverage person covering after 7pm

## 2020-03-25 LAB — CBC WITH DIFFERENTIAL/PLATELET
Abs Immature Granulocytes: 0.02 10*3/uL (ref 0.00–0.07)
Basophils Absolute: 0 10*3/uL (ref 0.0–0.1)
Basophils Relative: 0 %
Eosinophils Absolute: 0 10*3/uL (ref 0.0–0.5)
Eosinophils Relative: 0 %
HCT: 33.2 % — ABNORMAL LOW (ref 39.0–52.0)
Hemoglobin: 11.7 g/dL — ABNORMAL LOW (ref 13.0–17.0)
Immature Granulocytes: 0 %
Lymphocytes Relative: 15 %
Lymphs Abs: 0.9 10*3/uL (ref 0.7–4.0)
MCH: 37.7 pg — ABNORMAL HIGH (ref 26.0–34.0)
MCHC: 35.2 g/dL (ref 30.0–36.0)
MCV: 107.1 fL — ABNORMAL HIGH (ref 80.0–100.0)
Monocytes Absolute: 0.8 10*3/uL (ref 0.1–1.0)
Monocytes Relative: 14 %
Neutro Abs: 4.3 10*3/uL (ref 1.7–7.7)
Neutrophils Relative %: 71 %
Platelets: 338 10*3/uL (ref 150–400)
RBC: 3.1 MIL/uL — ABNORMAL LOW (ref 4.22–5.81)
RDW: 11.3 % — ABNORMAL LOW (ref 11.5–15.5)
WBC: 6 10*3/uL (ref 4.0–10.5)
nRBC: 0 % (ref 0.0–0.2)

## 2020-03-25 LAB — GLUCOSE, CAPILLARY
Glucose-Capillary: 166 mg/dL — ABNORMAL HIGH (ref 70–99)
Glucose-Capillary: 200 mg/dL — ABNORMAL HIGH (ref 70–99)
Glucose-Capillary: 180 mg/dL — ABNORMAL HIGH (ref 70–99)
Glucose-Capillary: 182 mg/dL — ABNORMAL HIGH (ref 70–99)

## 2020-03-25 LAB — MAGNESIUM: Magnesium: 2.1 mg/dL (ref 1.7–2.4)

## 2020-03-25 LAB — COMPREHENSIVE METABOLIC PANEL
ALT: 106 U/L — ABNORMAL HIGH (ref 0–44)
AST: 65 U/L — ABNORMAL HIGH (ref 15–41)
Albumin: 2.9 g/dL — ABNORMAL LOW (ref 3.5–5.0)
Alkaline Phosphatase: 70 U/L (ref 38–126)
Anion gap: 9 (ref 5–15)
BUN: 15 mg/dL (ref 6–20)
CO2: 27 mmol/L (ref 22–32)
Calcium: 8.6 mg/dL — ABNORMAL LOW (ref 8.9–10.3)
Chloride: 100 mmol/L (ref 98–111)
Creatinine, Ser: 0.52 mg/dL — ABNORMAL LOW (ref 0.61–1.24)
GFR calc Af Amer: >60 mL/min (ref >60)
GFR calc non Af Amer: >60 mL/min (ref >60)
Glucose, Bld: 226 mg/dL — ABNORMAL HIGH (ref 70–99)
Potassium: 4.1 mmol/L (ref 3.5–5.1)
Sodium: 136 mmol/L (ref 135–145)
Total Bilirubin: 1.2 mg/dL (ref 0.3–1.2)
Total Protein: 6.3 g/dL — ABNORMAL LOW (ref 6.5–8.1)

## 2020-03-25 LAB — D-DIMER, QUANTITATIVE: D-Dimer, Quant: 0.7 ug/mL-FEU — ABNORMAL HIGH (ref 0.00–0.50)

## 2020-03-25 LAB — PHOSPHORUS: Phosphorus: 3.8 mg/dL (ref 2.5–4.6)

## 2020-03-25 LAB — TSH: TSH: 2.625 u[IU]/mL (ref 0.350–4.500)

## 2020-03-25 LAB — FERRITIN: Ferritin: 3917 ng/mL — ABNORMAL HIGH (ref 24–336)

## 2020-03-25 LAB — SEDIMENTATION RATE: Sed Rate: 77 mm/hr — ABNORMAL HIGH (ref 0–16)

## 2020-03-25 LAB — FOLATE: Folate: 21.7 ng/mL (ref >5.9)

## 2020-03-25 LAB — C-REACTIVE PROTEIN: CRP: 3.7 mg/dL — ABNORMAL HIGH (ref <1.0)

## 2020-03-25 MED ORDER — FUROSEMIDE 10 MG/ML IJ SOLN
20.0000 mg | Freq: Once | INTRAMUSCULAR | Status: AC
  Administered 2020-03-25: 20 mg via INTRAVENOUS
  Filled 2020-03-25: qty 2

## 2020-03-25 MED ORDER — INSULIN ASPART 100 UNIT/ML ~~LOC~~ SOLN
3.0000 [IU] | Freq: Three times a day (TID) | SUBCUTANEOUS | Status: DC
  Administered 2020-03-25 – 2020-03-26 (×3): 3 [IU] via SUBCUTANEOUS

## 2020-03-25 NOTE — Progress Notes (Signed)
PROGRESS NOTE    David Garrett    Code Status: Full Code  GEZ:[id number] DOB: Aug 26, 1961 DOA: 03/22/2020 LOS: 3 days  PCP: System, Pcp Not In CC:  Chief Complaint  Patient presents with  . Covid +  . Shortness of Breath  . Cough  . Fatigue       Hospital Summary   This is a 59 year old male with a history of DM2 diabetic polyneuropathy, hyperlipidemia, alcohol abuse with daily use, macrocytosis, tobacco abuse who presented with chief complaint of fatigue, worsening shortness of breath and cough with diarrhea and recent COVID-19 positive contact with family.  Found to be COVID-19 positive and started on therapy.  A & P   Active Problems:   Pneumonia due to COVID-19 virus   Gastroenteritis due to COVID-19 virus   Acute respiratory disease due to COVID-19 virus   Diarrhea   Hyponatremia   Hypochloremia   Hypokalemia   Abnormal LFTs   Macrocytic anemia   Type 2 diabetes mellitus with hyperlipidemia (HCC)   Decreased oral intake   Generalized weakness   Fatigue   1. Acute hypoxic respiratory failure secondary to COVID-19 pneumonia without respiratory distress a. Still on 2-3L [place], desatted to 85% with conversation today b. D-dimer decreased c. Day 4/5 remdesivir, day 4/10 dexamethasone d. CRP improved, did not receive Actemra due to elevated LFTs e. Continue incentive spirometry f. Encourage pronation as tolerated g. Continue Combivent and vitamins h. Hopefully discharge tomorrow if he can tolerate at max 2 L/min nasal cannula on exertion i. Low dose lasix x 1  2. Musculoskeletal Chest pain, possibly costochondritis induced by cough resolved a. lidoderm patch b. Robitussin DM  3. COVID-19 induced gastroenteritis/diarrhea, resolved  4. Hyponatremia/hypochloremia, resolved with IV fluids  5. Hypokalemia likely from diarrhea, resolved  6. Abnormal LFTs, likely elevated from COVID-19, improved  7. Macrocytic anemia in setting of alcohol abuse a. Vitamin B12 level  normal b. Check folate level   8. Alcohol use a. Alcohol level negative, patient reported last drink was 10 days ago b. Discontinued CIWA protocol  9. Type 2 diabetes a. HA1c: 7.6 b. Add levemir 4 u bid and increase Novolog to 3 units TID with meals and continue sliding scale c. Diabetic coordinator on board  10. Poor p.o. intake in setting of recent tooth extractions and COVID-19 disease a. Follow-up with nutritionist recommendations  11. Generalized weakness and fatigue a. No further PT or OT needed at discharge  12. Hyperlipidemia a. Holding statin due to elevated LFTs  DVT prophylaxis: Lovenox Family Communication: Patient's wife has been updated today Disposition Plan:  Status is: Inpatient  Remains inpatient appropriate because:Unsafe d/c plan continues with hypoxia   Dispo: The patient is from: Home              Anticipated d/c is to: Home              Anticipated d/c date is: 1 day              Patient currently is not medically stable to d/c.                   Pressure injury documentation    None  Consultants  None  Procedures  None  Antibiotics   Anti-infectives (From admission, onward)   Start     Dose/Rate Route Frequency Ordered Stop   03/23/20 1000  remdesivir 100 mg in sodium chloride 0.9 % 100 mL IVPB  100 mg 200 mL/hr over 30 Minutes Intravenous Daily 03/22/20 1656 03/27/20 0959   03/22/20 1700  remdesivir 100 mg in sodium chloride 0.9 % 100 mL IVPB     100 mg 200 mL/hr over 30 Minutes Intravenous Every 1 hr x 2 03/22/20 1656 03/22/20 2241        Subjective   On my arrival patient was sleeping comfortably and SPO2 in mid to high 90s.  After I woke him up he stated he has no complaints and is feeling much better and his chest pain has resolved.  As we were talking his SPO2 dropped to the mid 80s.  I had to increase his supplemental O2 to 3 L nasal cannula for 88%.  He remained asymptomatic.  He was given an incentive  spirometer at bedside with improvement to 89%-90% Objective   Vitals:   03/24/20 0524 03/24/20 1157 03/24/20 2101 03/25/20 0442  BP: 105/74 112/79 109/61 101/71  Pulse: 76 80 83 77  Resp: 18 (!) 24 (!) 21 (!) 22  Temp: 98.6 F (37 C) 98 F (36.7 C) 98.2 F (36.8 C) 97.7 F (36.5 C)  TempSrc: Oral Oral Oral Oral  SpO2: 98% 90% 94% 94%  Weight:      Height:       No intake or output data in the 24 hours ending 03/25/20 1325 Filed Weights   03/23/20 0236  Weight: 57.6 kg    Examination:  Physical Exam Vitals and nursing note reviewed.  Constitutional:      Appearance: Normal appearance.  HENT:     Head: Normocephalic and atraumatic.  Eyes:     Conjunctiva/sclera: Conjunctivae normal.  Cardiovascular:     Rate and Rhythm: Normal rate and regular rhythm.  Pulmonary:     Effort: Pulmonary effort is normal.     Breath sounds: Rales present.  Abdominal:     General: Abdomen is flat.     Palpations: Abdomen is soft.  Musculoskeletal:        General: No swelling or tenderness.  Skin:    Coloration: Skin is not jaundiced or pale.  Neurological:     Mental Status: He is alert. Mental status is at baseline.  Psychiatric:        Mood and Affect: Mood normal.        Behavior: Behavior normal.     Data Reviewed: I have personally reviewed following labs and imaging studies  CBC: Recent Labs  Lab 03/22/20 1504 03/23/20 0407 03/24/20 0353 03/25/20 0437  WBC 3.7* 3.8* 2.8* 6.0  NEUTROABS 2.4 2.1 1.6* 4.3  HGB 12.5* 11.9* 11.9* 11.7*  HCT 35.1* 33.0* 34.0* 33.2*  MCV 106.4* 107.5* 108.3* 107.1*  PLT 227 217 271 338   Basic Metabolic Panel: Recent Labs  Lab 03/22/20 1504 03/23/20 0407 03/24/20 0353 03/25/20 0437  NA 131* 137 136 136  K 3.3* 3.7 4.1 4.1  CL 93* 102 100 100  CO2 28 27 24 27   GLUCOSE 230* 176* 218* 226*  BUN 10 9 12 15   CREATININE 0.62 0.46* 0.45* 0.52*  CALCIUM 8.1* 8.1* 8.5* 8.6*  MG 2.0 2.0 2.2 2.1  PHOS  --  2.8 3.6 3.8    GFR: Estimated Creatinine Clearance: 81 mL/min (A) (by C-G formula based on SCr of 0.52 mg/dL (L)). Liver Function Tests: Recent Labs  Lab 03/22/20 1504 03/23/20 0407 03/24/20 0353 03/25/20 0437  AST 125* 120* 126* 65*  ALT 80* 87* 126* 106*  ALKPHOS 70 69 77 70  BILITOT  1.1 1.4* 1.2 1.2  PROT 6.5 6.2* 6.5 6.3*  ALBUMIN 3.3* 3.0* 3.0* 2.9*   No results for input(s): LIPASE, AMYLASE in the last 168 hours. No results for input(s): AMMONIA in the last 168 hours. Coagulation Profile: No results for input(s): INR, PROTIME in the last 168 hours. Cardiac Enzymes: No results for input(s): CKTOTAL, CKMB, CKMBINDEX, TROPONINI in the last 168 hours. BNP (last 3 results) No results for input(s): PROBNP in the last 8760 hours. HbA1C: Recent Labs    03/23/20 0407  HGBA1C 7.6*   CBG: Recent Labs  Lab 03/24/20 1154 03/24/20 1706 03/24/20 2105 03/25/20 0728 03/25/20 1147  GLUCAP 250* 254* 307* 166* 200*   Lipid Profile: Recent Labs    03/22/20 1504  TRIG 145   Thyroid Function Tests: No results for input(s): TSH, T4TOTAL, FREET4, T3FREE, THYROIDAB in the last 72 hours. Anemia Panel: Recent Labs    03/24/20 0353 03/25/20 0437  VITAMINB12 1,914*  --   FERRITIN 5,979* 3,917*   Sepsis Labs: Recent Labs  Lab 03/22/20 1504  PROCALCITON <0.10  LATICACIDVEN 1.5    Recent Results (from the past 240 hour(s))  Respiratory Panel by RT PCR (Flu A&B, Covid) - Nasopharyngeal Swab     Status: Abnormal   Collection Time: 03/22/20  2:55 PM   Specimen: Nasopharyngeal Swab  Result Value Ref Range Status   SARS Coronavirus 2 by RT PCR POSITIVE (A) NEGATIVE Final    Comment: RESULT CALLED TO, READ BACK BY AND VERIFIED WITH: C.JACKSON AT 2004 ON 03/22/20 BY N.THOMPSON (NOTE) SARS-CoV-2 target nucleic acids are DETECTED. SARS-CoV-2 RNA is generally detectable in upper respiratory specimens  during the acute phase of infection. Positive results are indicative of the presence of  the identified virus, but do not rule out bacterial infection or co-infection with other pathogens not detected by the test. Clinical correlation with patient history and other diagnostic information is necessary to determine patient infection status. The expected result is Negative. Fact Sheet for Patients:  PinkCheek.be Fact Sheet for Healthcare Providers: GravelBags.it This test is not yet approved or cleared by the [place] FDA and  has been authorized for detection and/or diagnosis of SARS-CoV-2 by FDA under an Emergency Use Authorization (EUA).  This EUA will remain in effect (meaning this test can be  used) for the duration of  the COVID-19 declaration under Section 564(b)(1) of the Act, 21 U.S.C. section 360bbb-3(b)(1), unless the authorization is terminated or revoked sooner.    Influenza A by PCR NEGATIVE NEGATIVE Final   Influenza B by PCR NEGATIVE NEGATIVE Final    Comment: (NOTE) The Xpert Xpress SARS-CoV-2/FLU/RSV assay is intended as an aid in  the diagnosis of influenza from Nasopharyngeal swab specimens and  should not be used as a sole basis for treatment. Nasal washings and  aspirates are unacceptable for Xpert Xpress SARS-CoV-2/FLU/RSV  testing. Fact Sheet for Patients: PinkCheek.be Fact Sheet for Healthcare Providers: GravelBags.it This test is not yet approved or cleared by the [place] FDA and  has been authorized for detection and/or diagnosis of SARS-CoV-2 by  FDA under an Emergency Use Authorization (EUA). This EUA will remain  in effect (meaning this test can be used) for the duration of the  Covid-19 declaration under Section 564(b)(1) of the Act, 21  U.S.C. section 360bbb-3(b)(1), unless the authorization is  terminated or revoked. Performed at [facility], [place] [address] [place], [place] [address]    Culture, blood (routine x 2)  Status: None (Preliminary result)   Collection Time: 03/22/20  3:04 PM   Specimen: BLOOD  Result Value Ref Range Status   Specimen Description   Final    BLOOD LEFT ANTECUBITAL Performed at [facility], [address], [place], [place] [address]    Special Requests   Final    BOTTLES DRAWN AEROBIC AND ANAEROBIC Blood Culture results may not be optimal due to an excessive volume of blood received in culture bottles Performed at [facility], [address] [place], [place] [address]    Culture   Final    NO GROWTH 2 DAYS Performed at [facility], [address] [place], [place] [address]    Report Status PENDING  Incomplete  Culture, blood (routine x 2)     Status: None (Preliminary result)   Collection Time: 03/22/20  3:19 PM   Specimen: BLOOD  Result Value Ref Range Status   Specimen Description   Final    BLOOD RIGHT ANTECUBITAL Performed at [facility], [address], [place], [place] [address]    Special Requests   Final    BOTTLES DRAWN AEROBIC AND ANAEROBIC Blood Culture results may not be optimal due to an excessive volume of blood received in culture bottles Performed at [facility], [address], [place], [place] [address]    Culture   Final    NO GROWTH 2 DAYS Performed at [facility], [address], [place], [place] [address]    Report Status PENDING  Incomplete         Radiology Studies: No results found.      Scheduled Meds: . vitamin C  500 mg Oral Daily  . dexamethasone (DECADRON) injection  6 mg Intravenous Q24H  . enoxaparin (LOVENOX) injection  40 mg Subcutaneous Daily  . feeding supplement (KATE FARMS STANDARD 1.4)  325 mL Oral Q24H  . feeding supplement (PRO-STAT SUGAR FREE 64)  30 mL Oral TID BM  . folic acid  1 mg Oral Daily  . insulin aspart  0-5 Units Subcutaneous QHS  . insulin aspart  0-9 Units  Subcutaneous TID WC  . insulin aspart  3 Units Subcutaneous TID WC  . insulin detemir  4 Units Subcutaneous BID  . Ipratropium-Albuterol  1 puff Inhalation Q6H  . lidocaine  1 patch Transdermal Q24H  . multivitamin with minerals  1 tablet Oral Daily  . thiamine  100 mg Oral Daily   Or  . thiamine  100 mg Intravenous Daily  . zinc sulfate  220 mg Oral Daily   Continuous Infusions: . remdesivir 100 mg in NS 100 mL 100 mg (03/25/20 1119)     Time spent: 35 minutes with over 50% of the time coordinating the patient's care    Jae Dire, DO [facility] Pager [phone]  Call night coverage person covering after 7pm

## 2020-03-25 NOTE — Progress Notes (Signed)
Physical Therapy Treatment Patient Details Name: David Garrett MRN: [id number] DOB: 10/11/1961 Today's Date: 03/25/2020    History of Present Illness David Garrett is a 59 y.o. male with medical history significant for Diabetes Mellitus Type 2, hyperlipidemia, alcohol abuse with daily use, as well as tobacco abuse. Pt presents with a chief complaint of fatigue, worsening shortness of breath and cough along with diarrhea.  Pt reports he was diagnosed with COVID-19 disease 8 days ago and was doing okay until he acutely worsened over the last week.     PT Comments    Patient indicating that he does not require O2 and removed. SPO2 dropped from 94% to 81 %. Replaced with  4 L with gradual increase to 90-% (2 minutes). Patient ambulated on 4 L with SPO2 dropping to 83% and gradual return to 90%. Moted 3/4 dyspnea when on RA.  . Encouraged IS. Continue  Progressive ambulation.   Follow Up Recommendations  No PT follow up     Equipment Recommendations  None recommended by PT    Recommendations for Other Services       Precautions / Restrictions Precautions Precautions: Other (comment) Precaution Comments: monitor sats, impulsive    Mobility  Bed Mobility Overal bed mobility: Modified Independent             General bed mobility comments: patient able to reposition himself in bed independently  Transfers   Equipment used: None Transfers: Sit to/from Stand Sit to Stand: Supervision         General transfer comment: for safety with management of lines  Ambulation/Gait Ambulation/Gait assistance: Min guard;Supervision Gait Distance (Feet): 200 Feet Assistive device: None Gait Pattern/deviations: Step-through pattern;Decreased stride length         Stairs             Wheelchair Mobility    Modified Rankin (Stroke Patients Only)       Balance Overall balance assessment: Needs assistance             Standing balance comment: 1 balance loss when turned quickly,  impulsive                            Cognition Arousal/Alertness: Awake/alert Behavior During Therapy: WFL for tasks assessed/performed Overall Cognitive Status: Within Functional Limits for tasks assessed                                        Exercises      General Comments General comments (skin integrity, edema, etc.): patient declined mobility OOB at this time, reports he has been getting up independently. Currently patient on 3L O2 fluctuating between 90-94% with talking and repositioning in bed.       Pertinent Vitals/Pain Pain Assessment: No/denies pain    Home Living                      Prior Function            PT Goals (current goals can now be found in the care plan section) Acute Rehab PT Goals Patient Stated Goal: hopefully home tomorrow Progress towards PT goals: Progressing toward goals    Frequency    Min 3X/week      PT Plan Current plan remains appropriate    Co-evaluation  AM-PAC PT "6 Clicks" Mobility   Outcome Measure  Help needed turning from your back to your side while in a flat bed without using bedrails?: None Help needed moving from lying on your back to sitting on the side of a flat bed without using bedrails?: None Help needed moving to and from a bed to a chair (including a wheelchair)?: None Help needed standing up from a chair using your arms (e.g., wheelchair or bedside chair)?: A Little Help needed to walk in hospital room?: A Little Help needed climbing 3-5 steps with a railing? : A Little 6 Click Score: 21    End of Session Equipment Utilized During Treatment: Oxygen Activity Tolerance: Patient tolerated treatment well Patient left: in bed;with call bell/phone within reach   PT Visit Diagnosis: Difficulty in walking, not elsewhere classified (R26.2)     Time: 1551-1610 PT Time Calculation (min) (ACUTE ONLY): 19 min  Charges:  $Gait Training: 8-22 mins                      Blanchard Kelch PT Acute Rehabilitation Services Pager [phone] Office [phone]    Rada Hay 03/25/2020, 4:13 PM

## 2020-03-25 NOTE — Progress Notes (Signed)
Occupational Therapy Treatment Patient Details Name: David Garrett MRN: [id number] DOB: February 27, 1961 Today's Date: 03/25/2020    History of present illness David Garrett is a 59 y.o. male with medical history significant for Diabetes Mellitus Type 2, hyperlipidemia, alcohol abuse with daily use, as well as tobacco abuse. Pt presents with a chief complaint of fatigue, worsening shortness of breath and cough along with diarrhea.  Pt reports he was diagnosed with COVID-19 disease 8 days ago and was doing okay until he acutely worsened over the last week.    OT comments  Patient in much better spirits this session, reports feeling much better with possibility of going home tomorrow. Note patient O2 fluctuate between 90-94% on 3L with talking and repositioning in bed. Educate patient on energy conservation strategies for home and provided with handout. Patient very appreciative, progressing towards goals.   Follow Up Recommendations  No OT follow up    Equipment Recommendations  None recommended by OT       Precautions / Restrictions Precautions Precautions: Other (comment) Precaution Comments: monitor sats       Mobility Bed Mobility               General bed mobility comments: patient able to reposition himself in bed independently         ADL either performed or assessed with clinical judgement   ADL Overall ADL's : Needs assistance/impaired                                       General ADL Comments: educate patient on energy conservation strategies and provided handout to patient. discuss with patient which strategies would be more applicable for him at home regarding self care such as dressing, bathing. Educate patient on importance of incentive spirometer, patient reports it has been helping him greatly.                Cognition Arousal/Alertness: Awake/alert Behavior During Therapy: WFL for tasks assessed/performed Overall Cognitive Status: Within Functional  Limits for tasks assessed                                                General Comments patient declined mobility OOB at this time, reports he has been getting up independently. Currently patient on 3L O2 fluctuating between 90-94% with talking and repositioning in bed.     Pertinent Vitals/ Pain       Pain Assessment: No/denies pain         Frequency  Min 2X/week        Progress Toward Goals  OT Goals(current goals can now be found in the care plan section)  Progress towards OT goals: Progressing toward goals  Acute Rehab OT Goals Patient Stated Goal: hopefully home tomorrow OT Goal Formulation: With patient Time For Goal Achievement: 04/06/20 Potential to Achieve Goals: Good ADL Goals Pt Will Perform Lower Body Dressing: Independently;sit to/from stand Pt Will Transfer to Toilet: Independently;ambulating Additional ADL Goal #1: Patient will identify 3 energy conservation strategies to implement at home in order to maximize patient safety/independence with self care. Additional ADL Goal #2: Patient will demonstrate pursed lip breathing techniques during functional activity and self care tasks with less than 25% verbal cues.  Plan Discharge plan remains appropriate  AM-PAC OT "6 Clicks" Daily Activity     Outcome Measure   Help from another person eating meals?: None Help from another person taking care of personal grooming?: A Little Help from another person toileting, which includes using toliet, bedpan, or urinal?: A Little Help from another person bathing (including washing, rinsing, drying)?: A Little Help from another person to put on and taking off regular upper body clothing?: A Little Help from another person to put on and taking off regular lower body clothing?: A Little 6 Click Score: 19    End of Session Equipment Utilized During Treatment: Oxygen  OT Visit Diagnosis: Other abnormalities of gait and mobility (R26.89)    Activity Tolerance Patient tolerated treatment well   Patient Left in bed;with call bell/phone within reach           Time: 1341-1350 OT Time Calculation (min): 9 min  Charges: OT General Charges $OT Visit: 1 Visit OT Treatments $Self Care/Home Management : 8-22 mins  Delbert Phenix OT Pager: [place] 03/25/2020, 2:16 PM

## 2020-03-25 NOTE — Progress Notes (Signed)
Inpatient Diabetes Program Recommendations  AACE/ADA: New Consensus Statement on Inpatient Glycemic Control (2015)  Target Ranges:  Prepandial:   less than 140 mg/dL      Peak postprandial:   less than 180 mg/dL (1-2 hours)      Critically ill patients:  140 - 180 mg/dL   Lab Results  Component Value Date   GLUCAP 200 (H) 03/25/2020   HGBA1C 7.6 (H) 03/23/2020    Review of Glycemic Control  Current orders for Inpatient glycemic control:  Levemir 4 units bid, Novolog 0-9 units tidwc and hs + 2 units tidwc  Inpatient Diabetes Program Recommendations:    Increase Novolog to 3 units tidwc for meal coverage insulin.  Will f/u with pt's Mother again this afternoon regarding insulin administration.  Continue to follow.  Thank you. Ailene Ards, RD, LDN, CDE Inpatient Diabetes Coordinator [phone]

## 2020-03-25 NOTE — TOC Progression Note (Addendum)
Transition of Care [facility]) - Progression Note    Patient Details  Name: Jilberto Vanderwall MRN: [id number] Date of Birth: Aug 23, 1961  [facility] CM/SW Contact  Geni Bers, RN Phone Number: 03/25/2020, 10:37 AM  Clinical Narrative:    Will need O2 sats/desats on pt for home O2. TOC will continue to follow. Pt's PCP/wife is Dr. Dan Humphreys with [facility].    Expected Discharge Plan: Home/Self Care Barriers to Discharge: No Barriers Identified  Expected Discharge Plan and Services Expected Discharge Plan: Home/Self Care       Living arrangements for the past 2 months: Single Family Home                                       Social Determinants of Health (SDOH) Interventions    Readmission Risk Interventions No flowsheet data found.

## 2020-03-26 LAB — C-REACTIVE PROTEIN: CRP: 4.1 mg/dL — ABNORMAL HIGH (ref <1.0)

## 2020-03-26 LAB — COMPREHENSIVE METABOLIC PANEL
ALT: 82 U/L — ABNORMAL HIGH (ref 0–44)
AST: 42 U/L — ABNORMAL HIGH (ref 15–41)
Albumin: 2.9 g/dL — ABNORMAL LOW (ref 3.5–5.0)
Alkaline Phosphatase: 72 U/L (ref 38–126)
Anion gap: 7 (ref 5–15)
BUN: 17 mg/dL (ref 6–20)
CO2: 31 mmol/L (ref 22–32)
Calcium: 8.4 mg/dL — ABNORMAL LOW (ref 8.9–10.3)
Chloride: 97 mmol/L — ABNORMAL LOW (ref 98–111)
Creatinine, Ser: 0.57 mg/dL — ABNORMAL LOW (ref 0.61–1.24)
GFR calc Af Amer: >60 mL/min (ref >60)
GFR calc non Af Amer: >60 mL/min (ref >60)
Glucose, Bld: 228 mg/dL — ABNORMAL HIGH (ref 70–99)
Potassium: 4.1 mmol/L (ref 3.5–5.1)
Sodium: 135 mmol/L (ref 135–145)
Total Bilirubin: 1.2 mg/dL (ref 0.3–1.2)
Total Protein: 6.3 g/dL — ABNORMAL LOW (ref 6.5–8.1)

## 2020-03-26 LAB — CBC WITH DIFFERENTIAL/PLATELET
Abs Immature Granulocytes: 0.03 10*3/uL (ref 0.00–0.07)
Basophils Absolute: 0 10*3/uL (ref 0.0–0.1)
Basophils Relative: 0 %
Eosinophils Absolute: 0 10*3/uL (ref 0.0–0.5)
Eosinophils Relative: 0 %
HCT: 34.8 % — ABNORMAL LOW (ref 39.0–52.0)
Hemoglobin: 11.9 g/dL — ABNORMAL LOW (ref 13.0–17.0)
Immature Granulocytes: 1 %
Lymphocytes Relative: 14 %
Lymphs Abs: 0.8 10*3/uL (ref 0.7–4.0)
MCH: 37.2 pg — ABNORMAL HIGH (ref 26.0–34.0)
MCHC: 34.2 g/dL (ref 30.0–36.0)
MCV: 108.8 fL — ABNORMAL HIGH (ref 80.0–100.0)
Monocytes Absolute: 0.8 10*3/uL (ref 0.1–1.0)
Monocytes Relative: 15 %
Neutro Abs: 3.8 10*3/uL (ref 1.7–7.7)
Neutrophils Relative %: 70 %
Platelets: 390 10*3/uL (ref 150–400)
RBC: 3.2 MIL/uL — ABNORMAL LOW (ref 4.22–5.81)
RDW: 11.6 % (ref 11.5–15.5)
WBC: 5.4 10*3/uL (ref 4.0–10.5)
nRBC: 0 % (ref 0.0–0.2)

## 2020-03-26 LAB — FERRITIN: Ferritin: 2663 ng/mL — ABNORMAL HIGH (ref 24–336)

## 2020-03-26 LAB — MAGNESIUM: Magnesium: 2.2 mg/dL (ref 1.7–2.4)

## 2020-03-26 LAB — SEDIMENTATION RATE: Sed Rate: 70 mm/hr — ABNORMAL HIGH (ref 0–16)

## 2020-03-26 LAB — GLUCOSE, CAPILLARY
Glucose-Capillary: 165 mg/dL — ABNORMAL HIGH (ref 70–99)
Glucose-Capillary: 250 mg/dL — ABNORMAL HIGH (ref 70–99)
Glucose-Capillary: 99 mg/dL (ref 70–99)
Glucose-Capillary: 192 mg/dL — ABNORMAL HIGH (ref 70–99)

## 2020-03-26 LAB — PHOSPHORUS: Phosphorus: 5.1 mg/dL — ABNORMAL HIGH (ref 2.5–4.6)

## 2020-03-26 LAB — D-DIMER, QUANTITATIVE: D-Dimer, Quant: 0.64 ug/mL-FEU — ABNORMAL HIGH (ref 0.00–0.50)

## 2020-03-26 MED ORDER — INSULIN ASPART 100 UNIT/ML ~~LOC~~ SOLN
5.0000 [IU] | Freq: Three times a day (TID) | SUBCUTANEOUS | Status: DC
  Administered 2020-03-26 – 2020-03-28 (×6): 5 [IU] via SUBCUTANEOUS

## 2020-03-26 MED ORDER — SODIUM CHLORIDE 0.9 % IV BOLUS
500.0000 mL | Freq: Once | INTRAVENOUS | Status: AC
  Administered 2020-03-26: 500 mL via INTRAVENOUS

## 2020-03-26 NOTE — Progress Notes (Addendum)
Pt was able to tolerate one hour of lying in a prone position  last night. 02 remained in the 90's. I have encouraged the pt to try to sit in the recliner today, he has agreered to try. I will continue to monitor. I will continue to encourage IS and flutter valve usage.

## 2020-03-26 NOTE — Progress Notes (Signed)
SATURATION QUALIFICATIONS: (This note is used to comply with regulatory documentation for home oxygen)  Patient Saturations on Room Air at Rest = 88%  Patient Saturations on Room Air while Ambulating = 83%  Patient Saturations on 4 Liters of oxygen while Ambulating = 92%  Please briefly explain why patient needs home oxygen: may need home o2 since patient desats below 85% with very minimal exertion on RA.

## 2020-03-26 NOTE — Progress Notes (Signed)
PROGRESS NOTE    David Garrett    Code Status: Full Code  JQB:[id number] DOB: 1961/01/18 DOA: 03/22/2020 LOS: 4 days  PCP: System, Pcp Not In CC:  Chief Complaint  Patient presents with  . Covid +  . Shortness of Breath  . Cough  . Fatigue       Hospital Summary   This is a 59 year old male with a history of DM2 diabetic polyneuropathy, hyperlipidemia, alcohol abuse with daily use, macrocytosis, tobacco abuse who presented with chief complaint of fatigue, worsening shortness of breath and cough with diarrhea and recent COVID-19 positive contact with family.  Found to be COVID-19 positive and started on therapy.  A & P   Active Problems:   Pneumonia due to COVID-19 virus   Gastroenteritis due to COVID-19 virus   Acute respiratory disease due to COVID-19 virus   Diarrhea   Hyponatremia   Hypochloremia   Hypokalemia   Abnormal LFTs   Macrocytic anemia   Type 2 diabetes mellitus with hyperlipidemia (HCC)   Decreased oral intake   Generalized weakness   Fatigue   1. Acute hypoxic respiratory failure secondary to COVID-19 pneumonia without respiratory distress a. Resting comfortably on 2 to 3 L at rest and desats quickly to mid to low 80s on exertion requiring 4 L nasal cannula b. D-dimer decreased c. Day 5/5 remdesivir, day 5/10 dexamethasone d. CRP improved, did not receive Actemra due to elevated LFTs e. Continue incentive spirometry f. Encourage pronation as tolerated g. Continue Combivent and vitamins  2. Musculoskeletal Chest pain, possibly costochondritis induced by cough resolved a. lidoderm patch as needed b. Robitussin DM  3. COVID-19 induced gastroenteritis/diarrhea, resolved  4. Hyponatremia/hypochloremia, resolved with IV fluids  5. Hypokalemia likely from diarrhea, resolved  6. Abnormal LFTs, likely elevated from COVID-19, improved  7. Macrocytic anemia likely from alcohol use a. Folate and B12 normal b. Outpatient follow-up  8. Alcohol use a. Alcohol  level negative, patient reported last drink was 10 days ago b. Discontinued CIWA protocol  9. Type 2 diabetes a. HA1c: 7.6 b. Continue Levemir 4 u bid and increase Novolog to 5 units TID with meals and continue sliding scale c. Diabetic coordinator on board  10. Poor p.o. intake in setting of recent tooth extractions and COVID-19 disease a. Follow-up with nutritionist recommendations  11. Generalized weakness and fatigue a. No further PT or OT needed at discharge  12. Hyperlipidemia a. Holding statin due to elevated LFTs  DVT prophylaxis: Lovenox Family Communication: Patient's wife has been updated today by phone Disposition Plan: Remains inpatient until he is able to tolerate ambulation with lower supplemental O2 requirements Status is: Inpatient  Remains inpatient appropriate because:Unsafe d/c plan continues with hypoxia with minimal exertion   Dispo: The patient is from: Home              Anticipated d/c is to: Home              Anticipated d/c date is: 2 days              Patient currently is not medically stable to d/c.    Pressure injury documentation    None  Consultants  None  Procedures  None  Antibiotics   Anti-infectives (From admission, onward)   Start     Dose/Rate Route Frequency Ordered Stop   03/23/20 1000  remdesivir 100 mg in sodium chloride 0.9 % 100 mL IVPB     100 mg 200 mL/hr over 30  Minutes Intravenous Daily 03/22/20 1656 03/26/20 0928   03/22/20 1700  remdesivir 100 mg in sodium chloride 0.9 % 100 mL IVPB     100 mg 200 mL/hr over 30 Minutes Intravenous Every 1 hr x 2 03/22/20 1656 03/22/20 2241        Subjective   States he feels well without any recurrent diarrhea.  No complaints at this time.  Ambulatory pulse ox showing patient desats significantly requiring 4 L nasal cannula to return to low 80s no overnight events Objective   Vitals:   03/25/20 1339 03/25/20 2016 03/26/20 0328 03/26/20 0330  BP: 107/67 102/66 93/69     Pulse: 83 85 78   Resp: 16 16 17    Temp: 98.3 F (36.8 C) 98.1 F (36.7 C) 97.6 F (36.4 C)   TempSrc: Oral Oral Oral   SpO2: 91% 92% 94%   Weight:    54 kg  Height:        Intake/Output Summary (Last 24 hours) at 03/26/2020 1432 Last data filed at 03/26/2020 1237 Gross per 24 hour  Intake 600 ml  Output --  Net 600 ml   Filed Weights   03/23/20 0236 03/26/20 0330  Weight: 57.6 kg 54 kg    Examination:  Physical Exam Vitals and nursing note reviewed.  Constitutional:      Appearance: Normal appearance.  HENT:     Head: Normocephalic and atraumatic.  Eyes:     Conjunctiva/sclera: Conjunctivae normal.  Cardiovascular:     Rate and Rhythm: Normal rate and regular rhythm.  Pulmonary:     Effort: Pulmonary effort is normal.     Breath sounds: Normal breath sounds.  Abdominal:     General: Abdomen is flat.     Palpations: Abdomen is soft.  Musculoskeletal:        General: No swelling or tenderness.  Skin:    Coloration: Skin is not jaundiced or pale.  Neurological:     Mental Status: He is alert. Mental status is at baseline.  Psychiatric:        Mood and Affect: Mood normal.        Behavior: Behavior normal.     Data Reviewed: I have personally reviewed following labs and imaging studies  CBC: Recent Labs  Lab 03/22/20 1504 03/23/20 0407 03/24/20 0353 03/25/20 0437 03/26/20 0412  WBC 3.7* 3.8* 2.8* 6.0 5.4  NEUTROABS 2.4 2.1 1.6* 4.3 3.8  HGB 12.5* 11.9* 11.9* 11.7* 11.9*  HCT 35.1* 33.0* 34.0* 33.2* 34.8*  MCV 106.4* 107.5* 108.3* 107.1* 108.8*  PLT 227 217 271 338 390   Basic Metabolic Panel: Recent Labs  Lab 03/22/20 1504 03/23/20 0407 03/24/20 0353 03/25/20 0437 03/26/20 0412  NA 131* 137 136 136 135  K 3.3* 3.7 4.1 4.1 4.1  CL 93* 102 100 100 97*  CO2 28 27 24 27 31   GLUCOSE 230* 176* 218* 226* 228*  BUN 10 9 12 15 17   CREATININE 0.62 0.46* 0.45* 0.52* 0.57*  CALCIUM 8.1* 8.1* 8.5* 8.6* 8.4*  MG 2.0 2.0 2.2 2.1 2.2  PHOS  --  2.8  3.6 3.8 5.1*   GFR: Estimated Creatinine Clearance: 75.9 mL/min (A) (by C-G formula based on SCr of 0.57 mg/dL (L)). Liver Function Tests: Recent Labs  Lab 03/22/20 1504 03/23/20 0407 03/24/20 0353 03/25/20 0437 03/26/20 0412  AST 125* 120* 126* 65* 42*  ALT 80* 87* 126* 106* 82*  ALKPHOS 70 69 77 70 72  BILITOT 1.1 1.4* 1.2 1.2 1.2  PROT 6.5 6.2* 6.5 6.3* 6.3*  ALBUMIN 3.3* 3.0* 3.0* 2.9* 2.9*   No results for input(s): LIPASE, AMYLASE in the last 168 hours. No results for input(s): AMMONIA in the last 168 hours. Coagulation Profile: No results for input(s): INR, PROTIME in the last 168 hours. Cardiac Enzymes: No results for input(s): CKTOTAL, CKMB, CKMBINDEX, TROPONINI in the last 168 hours. BNP (last 3 results) No results for input(s): PROBNP in the last 8760 hours. HbA1C: No results for input(s): HGBA1C in the last 72 hours. CBG: Recent Labs  Lab 03/25/20 1147 03/25/20 1632 03/25/20 2013 03/26/20 0729 03/26/20 1213  GLUCAP 200* 180* 182* 165* 250*   Lipid Profile: No results for input(s): CHOL, HDL, LDLCALC, TRIG, CHOLHDL, LDLDIRECT in the last 72 hours. Thyroid Function Tests: Recent Labs    03/25/20 1417  TSH 2.625   Anemia Panel: Recent Labs    03/24/20 0353 03/24/20 0353 03/25/20 0437 03/25/20 1417 03/26/20 0412  [id number] 1,914*  --   --   --   --   FOLATE  --   --   --  21.7  --   FERRITIN 5,979*   < > 3,917*  --  2,663*   < > = values in this interval not displayed.   Sepsis Labs: Recent Labs  Lab 03/22/20 1504  PROCALCITON <0.10  LATICACIDVEN 1.5    Recent Results (from the past 240 hour(s))  Respiratory Panel by RT PCR (Flu A&B, Covid) - Nasopharyngeal Swab     Status: Abnormal   Collection Time: 03/22/20  2:55 PM   Specimen: Nasopharyngeal Swab  Result Value Ref Range Status   SARS Coronavirus 2 by RT PCR POSITIVE (A) NEGATIVE Final    Comment: RESULT CALLED TO, READ BACK BY AND VERIFIED WITH: C.JACKSON AT 2004 ON 03/22/20 BY  N.THOMPSON (NOTE) SARS-CoV-2 target nucleic acids are DETECTED. SARS-CoV-2 RNA is generally detectable in upper respiratory specimens  during the acute phase of infection. Positive results are indicative of the presence of the identified virus, but do not rule out bacterial infection or co-infection with other pathogens not detected by the test. Clinical correlation with patient history and other diagnostic information is necessary to determine patient infection status. The expected result is Negative. Fact Sheet for Patients:  https://www.moore.com/ Fact Sheet for Healthcare Providers: https://www.young.biz/ This test is not yet approved or cleared by the [place] FDA and  has been authorized for detection and/or diagnosis of SARS-CoV-2 by FDA under an Emergency Use Authorization (EUA).  This EUA will remain in effect (meaning this test can be  used) for the duration of  the COVID-19 declaration under Section 564(b)(1) of the Act, 21 U.S.C. section 360bbb-3(b)(1), unless the authorization is terminated or revoked sooner.    Influenza A by PCR NEGATIVE NEGATIVE Final   Influenza B by PCR NEGATIVE NEGATIVE Final    Comment: (NOTE) The Xpert Xpress SARS-CoV-2/FLU/RSV assay is intended as an aid in  the diagnosis of influenza from Nasopharyngeal swab specimens and  should not be used as a sole basis for treatment. Nasal washings and  aspirates are unacceptable for Xpert Xpress SARS-CoV-2/FLU/RSV  testing. Fact Sheet for Patients: https://www.moore.com/ Fact Sheet for Healthcare Providers: https://www.young.biz/ This test is not yet approved or cleared by the [place] FDA and  has been authorized for detection and/or diagnosis of SARS-CoV-2 by  FDA under an Emergency Use Authorization (EUA). This EUA will remain  in effect (meaning this test can be used) for the duration of the  Covid-19  declaration under Section 564(b)(1) of the Act, 21  U.S.C. section 360bbb-3(b)(1), unless the authorization is  terminated or revoked. Performed at [facility], [address], [place], [place] [address]   Culture, blood (routine x 2)     Status: None (Preliminary result)   Collection Time: 03/22/20  3:04 PM   Specimen: BLOOD  Result Value Ref Range Status   Specimen Description   Final    BLOOD LEFT ANTECUBITAL Performed at [facility], [address] [place], [place] [address]    Special Requests   Final    BOTTLES DRAWN AEROBIC AND ANAEROBIC Blood Culture results may not be optimal due to an excessive volume of blood received in culture bottles Performed at [facility], [address] [place], [place] [address]    Culture   Final    NO GROWTH 4 DAYS Performed at [facility], [address], [place], [place] [address]    Report Status PENDING  Incomplete  Culture, blood (routine x 2)     Status: None (Preliminary result)   Collection Time: 03/22/20  3:19 PM   Specimen: BLOOD  Result Value Ref Range Status   Specimen Description   Final    BLOOD RIGHT ANTECUBITAL Performed at [facility], [address] [place], [place] [address]    Special Requests   Final    BOTTLES DRAWN AEROBIC AND ANAEROBIC Blood Culture results may not be optimal due to an excessive volume of blood received in culture bottles Performed at [facility], [address], [place], [place] [address]    Culture   Final    NO GROWTH 4 DAYS Performed at [facility], [address] [place], [place] [address]    Report Status PENDING  Incomplete         Radiology Studies: No results found.      Scheduled Meds: . vitamin C  500 mg Oral Daily  . dexamethasone (DECADRON) injection  6 mg Intravenous Q24H  . enoxaparin (LOVENOX) injection  40 mg Subcutaneous Daily  . feeding supplement  (KATE FARMS STANDARD 1.4)  325 mL Oral Q24H  . feeding supplement (PRO-STAT SUGAR FREE 64)  30 mL Oral TID BM  . folic acid  1 mg Oral Daily  . insulin aspart  0-5 Units Subcutaneous QHS  . insulin aspart  0-9 Units Subcutaneous TID WC  . insulin aspart  3 Units Subcutaneous TID WC  . insulin detemir  4 Units Subcutaneous BID  . Ipratropium-Albuterol  1 puff Inhalation Q6H  . lidocaine  1 patch Transdermal Q24H  . multivitamin with minerals  1 tablet Oral Daily  . thiamine  100 mg Oral Daily   Or  . thiamine  100 mg Intravenous Daily  . zinc sulfate  220 mg Oral Daily   Continuous Infusions:    Time spent: 25 minutes with over 50% of the time coordinating the patient's care    Jae Dire, DO [facility] Pager [phone]  Call night coverage person covering after 7pm

## 2020-03-27 ENCOUNTER — Inpatient Hospital Stay (HOSPITAL_COMMUNITY): Payer: 59

## 2020-03-27 LAB — CBC WITH DIFFERENTIAL/PLATELET
Abs Immature Granulocytes: 0.03 10*3/uL (ref 0.00–0.07)
Basophils Absolute: 0 10*3/uL (ref 0.0–0.1)
Basophils Relative: 0 %
Eosinophils Absolute: 0 10*3/uL (ref 0.0–0.5)
Eosinophils Relative: 0 %
HCT: 31.9 % — ABNORMAL LOW (ref 39.0–52.0)
Hemoglobin: 11 g/dL — ABNORMAL LOW (ref 13.0–17.0)
Immature Granulocytes: 1 %
Lymphocytes Relative: 18 %
Lymphs Abs: 0.8 10*3/uL (ref 0.7–4.0)
MCH: 37.3 pg — ABNORMAL HIGH (ref 26.0–34.0)
MCHC: 34.5 g/dL (ref 30.0–36.0)
MCV: 108.1 fL — ABNORMAL HIGH (ref 80.0–100.0)
Monocytes Absolute: 0.5 10*3/uL (ref 0.1–1.0)
Monocytes Relative: 11 %
Neutro Abs: 3 10*3/uL (ref 1.7–7.7)
Neutrophils Relative %: 70 %
Platelets: 456 10*3/uL — ABNORMAL HIGH (ref 150–400)
RBC: 2.95 MIL/uL — ABNORMAL LOW (ref 4.22–5.81)
RDW: 11.2 % — ABNORMAL LOW (ref 11.5–15.5)
WBC: 4.2 10*3/uL (ref 4.0–10.5)
nRBC: 0 % (ref 0.0–0.2)

## 2020-03-27 LAB — CULTURE, BLOOD (ROUTINE X 2)
Culture: NO GROWTH
Culture: NO GROWTH

## 2020-03-27 LAB — COMPREHENSIVE METABOLIC PANEL
ALT: 59 U/L — ABNORMAL HIGH (ref 0–44)
AST: 25 U/L (ref 15–41)
Albumin: 2.7 g/dL — ABNORMAL LOW (ref 3.5–5.0)
Alkaline Phosphatase: 67 U/L (ref 38–126)
Anion gap: 8 (ref 5–15)
BUN: 17 mg/dL (ref 6–20)
CO2: 29 mmol/L (ref 22–32)
Calcium: 8 mg/dL — ABNORMAL LOW (ref 8.9–10.3)
Chloride: 99 mmol/L (ref 98–111)
Creatinine, Ser: 0.49 mg/dL — ABNORMAL LOW (ref 0.61–1.24)
GFR calc Af Amer: >60 mL/min (ref >60)
GFR calc non Af Amer: >60 mL/min (ref >60)
Glucose, Bld: 234 mg/dL — ABNORMAL HIGH (ref 70–99)
Potassium: 4 mmol/L (ref 3.5–5.1)
Sodium: 136 mmol/L (ref 135–145)
Total Bilirubin: 1.1 mg/dL (ref 0.3–1.2)
Total Protein: 6.1 g/dL — ABNORMAL LOW (ref 6.5–8.1)

## 2020-03-27 LAB — PHOSPHORUS: Phosphorus: 4.3 mg/dL (ref 2.5–4.6)

## 2020-03-27 LAB — GLUCOSE, CAPILLARY
Glucose-Capillary: 197 mg/dL — ABNORMAL HIGH (ref 70–99)
Glucose-Capillary: 214 mg/dL — ABNORMAL HIGH (ref 70–99)
Glucose-Capillary: 178 mg/dL — ABNORMAL HIGH (ref 70–99)
Glucose-Capillary: 221 mg/dL — ABNORMAL HIGH (ref 70–99)

## 2020-03-27 LAB — D-DIMER, QUANTITATIVE: D-Dimer, Quant: 5.2 ug/mL-FEU — ABNORMAL HIGH (ref 0.00–0.50)

## 2020-03-27 LAB — C-REACTIVE PROTEIN: CRP: 4.5 mg/dL — ABNORMAL HIGH (ref <1.0)

## 2020-03-27 LAB — FERRITIN: Ferritin: 1901 ng/mL — ABNORMAL HIGH (ref 24–336)

## 2020-03-27 LAB — SEDIMENTATION RATE: Sed Rate: 89 mm/hr — ABNORMAL HIGH (ref 0–16)

## 2020-03-27 LAB — MAGNESIUM: Magnesium: 2.2 mg/dL (ref 1.7–2.4)

## 2020-03-27 MED ORDER — SODIUM CHLORIDE (PF) 0.9 % IJ SOLN
INTRAMUSCULAR | Status: AC
  Administered 2020-03-27: 10 mL
  Filled 2020-03-27: qty 50

## 2020-03-27 MED ORDER — IOHEXOL 350 MG/ML SOLN
100.0000 mL | Freq: Once | INTRAVENOUS | Status: AC | PRN
  Administered 2020-03-27: 100 mL via INTRAVENOUS

## 2020-03-27 MED ORDER — ENOXAPARIN SODIUM 40 MG/0.4ML ~~LOC~~ SOLN
40.0000 mg | Freq: Two times a day (BID) | SUBCUTANEOUS | Status: DC
  Administered 2020-03-27 – 2020-03-28 (×3): 40 mg via SUBCUTANEOUS
  Filled 2020-03-27 (×3): qty 0.4

## 2020-03-27 NOTE — Progress Notes (Signed)
PROGRESS NOTE    David Garrett    Code Status: Full Code  OXB:[id number] DOB: May 14, 1961 DOA: 03/22/2020 LOS: 5 days  PCP: System, Pcp Not In CC:  Chief Complaint  Patient presents with  . Covid +  . Shortness of Breath  . Cough  . Fatigue       Hospital Summary   This is a 59 year old male with a history of DM2 diabetic polyneuropathy, hyperlipidemia, alcohol abuse with daily use, macrocytosis, tobacco abuse who presented with chief complaint of fatigue, worsening shortness of breath and cough with diarrhea and recent COVID-19 positive contact with family.  Found to be COVID-19 positive and started on therapy.  4/18: hypotensive overnight, improved with IV fluids. D dimer 0.6->5, CTA chest ordered.   A & P   Active Problems:   Pneumonia due to COVID-19 virus   Gastroenteritis due to COVID-19 virus   Acute respiratory disease due to COVID-19 virus   Diarrhea   Hyponatremia   Hypochloremia   Hypokalemia   Abnormal LFTs   Macrocytic anemia   Type 2 diabetes mellitus with hyperlipidemia (HCC)   Decreased oral intake   Generalized weakness   Fatigue   1. Acute hypoxic respiratory failure secondary to COVID-19 pneumonia without respiratory distress a. Stable on 3L [place] b. D-dimer significant bump today 0.6->5.2, will get a CTA chest to rule out PE. Increase Lovenox dosing  c. Completed 5 days remdesivir, day 6/10 dexamethasone d. CRP stable, did not get actemra due to elevated LFTs e. Continue incentive spirometry f. Encourage pronation as tolerated g. OOB h. Continue Combivent and vitamins  2. Musculoskeletal left sided Chest pain, possibly costochondritis induced by cough returned but not as bad as before a. lidoderm patch as needed b. Robitussin DM  3. Hypotension a. BP 86/62 overnight, improved slightly with fluids b. Currently asymptomatic  c. Continue to monitor  4. COVID-19 induced gastroenteritis/diarrhea, resolved  5. Hyponatremia/hypochloremia, resolved with IV  fluids  6. Hypokalemia likely from diarrhea, resolved  7. Abnormal LFTs, likely elevated from COVID-19, improved  8. Macrocytic anemia likely from alcohol use a. Folate and B12 normal b. Outpatient follow-up  9. Alcohol use a. Alcohol level negative, patient reported last drink was 10 days ago b. No concern for withdrawal  10. Type 2 diabetes a. HA1c: 7.6 b. Continue Levemir 4 u bid and increased Novolog to 5 units TID with meals and continue sliding scale c. Diabetic coordinator on board  11. Poor p.o. intake in setting of recent tooth extractions and COVID-19 disease a. Follow-up with nutritionist recommendations  12. Generalized weakness and fatigue a. No further PT or OT needed at discharge  13. Hyperlipidemia a. Holding statin due to elevated LFTs  DVT prophylaxis: Lovenox Family Communication: Patient's wife has been updated today by phone yesterday Disposition Plan:  Status is: Inpatient  Remains inpatient appropriate because:Inpatient level of care appropriate due to severity of illness and hypotensive with persistent O2 needs   Dispo: The patient is from: Home              Anticipated d/c is to: Home              Anticipated d/c date is: 2 days              Patient currently is not medically stable to d/c.     Pressure injury documentation    None  Consultants  None  Procedures  None  Antibiotics   Anti-infectives (From admission, onward)  Start     Dose/Rate Route Frequency Ordered Stop   03/23/20 1000  remdesivir 100 mg in sodium chloride 0.9 % 100 mL IVPB     100 mg 200 mL/hr over 30 Minutes Intravenous Daily 03/22/20 1656 03/26/20 0928   03/22/20 1700  remdesivir 100 mg in sodium chloride 0.9 % 100 mL IVPB     100 mg 200 mL/hr over 30 Minutes Intravenous Every 1 hr x 2 03/22/20 1656 03/22/20 2241        Subjective   Overnight became hypotensive to 86/62. MD ordered IV fluids. Patient currently asymptomatic from a BP standpoint but  states that he has recurrence of his left sided chest wall pain with deep inspiration and cough but not as bad as when he presented. No other complaints.  Objective   Vitals:   03/26/20 1848 03/26/20 2200 03/27/20 0333 03/27/20 0513  BP:  90/60  96/60  Pulse: 89   65  Resp: 19   16  Temp:  98.7 F (37.1 C)  97.7 F (36.5 C)  TempSrc:  Oral  Oral  SpO2: 99% 98%  97%  Weight:   55.9 kg   Height:        Intake/Output Summary (Last 24 hours) at 03/27/2020 1111 Last data filed at 03/27/2020 0200 Gross per 24 hour  Intake 1498.4 ml  Output --  Net 1498.4 ml   Filed Weights   03/23/20 0236 03/26/20 0330 03/27/20 0333  Weight: 57.6 kg 54 kg 55.9 kg    Examination:  Physical Exam Vitals and nursing note reviewed.  Constitutional:      Appearance: Normal appearance.  HENT:     Head: Normocephalic and atraumatic.  Eyes:     Conjunctiva/sclera: Conjunctivae normal.  Cardiovascular:     Rate and Rhythm: Normal rate and regular rhythm.  Pulmonary:     Effort: Pulmonary effort is normal.     Comments: Bibasilar rales Chest:     Chest wall: Tenderness present.     Comments: Left chest tenderness to palpation of costochondral junction Abdominal:     General: Abdomen is flat.     Palpations: Abdomen is soft.  Musculoskeletal:        General: No swelling or tenderness.  Skin:    Coloration: Skin is not jaundiced or pale.  Neurological:     Mental Status: He is alert. Mental status is at baseline.  Psychiatric:        Mood and Affect: Mood normal.        Behavior: Behavior normal.     Data Reviewed: I have personally reviewed following labs and imaging studies  CBC: Recent Labs  Lab 03/23/20 0407 03/24/20 0353 03/25/20 0437 03/26/20 0412 03/27/20 0431  WBC 3.8* 2.8* 6.0 5.4 4.2  NEUTROABS 2.1 1.6* 4.3 3.8 3.0  HGB 11.9* 11.9* 11.7* 11.9* 11.0*  HCT 33.0* 34.0* 33.2* 34.8* 31.9*  MCV 107.5* 108.3* 107.1* 108.8* 108.1*  PLT 217 271 338 390 456*   Basic  Metabolic Panel: Recent Labs  Lab 03/23/20 0407 03/24/20 0353 03/25/20 0437 03/26/20 0412 03/27/20 0431  NA 137 136 136 135 136  K 3.7 4.1 4.1 4.1 4.0  CL 102 100 100 97* 99  CO2 27 24 27 31 29   GLUCOSE 176* 218* 226* 228* 234*  BUN 9 12 15 17 17   CREATININE 0.46* 0.45* 0.52* 0.57* 0.49*  CALCIUM 8.1* 8.5* 8.6* 8.4* 8.0*  MG 2.0 2.2 2.1 2.2 2.2  PHOS 2.8 3.6 3.8 5.1* 4.3  GFR: Estimated Creatinine Clearance: 78.6 mL/min (A) (by C-G formula based on SCr of 0.49 mg/dL (L)). Liver Function Tests: Recent Labs  Lab 03/23/20 0407 03/24/20 0353 03/25/20 0437 03/26/20 0412 03/27/20 0431  AST 120* 126* 65* 42* 25  ALT 87* 126* 106* 82* 59*  ALKPHOS 69 77 70 72 67  BILITOT 1.4* 1.2 1.2 1.2 1.1  PROT 6.2* 6.5 6.3* 6.3* 6.1*  ALBUMIN 3.0* 3.0* 2.9* 2.9* 2.7*   No results for input(s): LIPASE, AMYLASE in the last 168 hours. No results for input(s): AMMONIA in the last 168 hours. Coagulation Profile: No results for input(s): INR, PROTIME in the last 168 hours. Cardiac Enzymes: No results for input(s): CKTOTAL, CKMB, CKMBINDEX, TROPONINI in the last 168 hours. BNP (last 3 results) No results for input(s): PROBNP in the last 8760 hours. HbA1C: No results for input(s): HGBA1C in the last 72 hours. CBG: Recent Labs  Lab 03/26/20 0729 03/26/20 1213 03/26/20 1710 03/26/20 2018 03/27/20 0808  GLUCAP 165* 250* 99 192* 197*   Lipid Profile: No results for input(s): CHOL, HDL, LDLCALC, TRIG, CHOLHDL, LDLDIRECT in the last 72 hours. Thyroid Function Tests: Recent Labs    03/25/20 1417  TSH 2.625   Anemia Panel: Recent Labs    03/25/20 0437 03/25/20 1417 03/26/20 0412 03/27/20 0431  FOLATE  --  21.7  --   --   FERRITIN   < >  --  2,663* 1,901*   < > = values in this interval not displayed.   Sepsis Labs: Recent Labs  Lab 03/22/20 1504  PROCALCITON <0.10  LATICACIDVEN 1.5    Recent Results (from the past 240 hour(s))  Respiratory Panel by RT PCR (Flu A&B,  Covid) - Nasopharyngeal Swab     Status: Abnormal   Collection Time: 03/22/20  2:55 PM   Specimen: Nasopharyngeal Swab  Result Value Ref Range Status   SARS Coronavirus 2 by RT PCR POSITIVE (A) NEGATIVE Final    Comment: RESULT CALLED TO, READ BACK BY AND VERIFIED WITH: C.JACKSON AT 2004 ON 03/22/20 BY N.THOMPSON (NOTE) SARS-CoV-2 target nucleic acids are DETECTED. SARS-CoV-2 RNA is generally detectable in upper respiratory specimens  during the acute phase of infection. Positive results are indicative of the presence of the identified virus, but do not rule out bacterial infection or co-infection with other pathogens not detected by the test. Clinical correlation with patient history and other diagnostic information is necessary to determine patient infection status. The expected result is Negative. Fact Sheet for Patients:  https://www.moore.com/ Fact Sheet for Healthcare Providers: https://www.young.biz/ This test is not yet approved or cleared by the [place] FDA and  has been authorized for detection and/or diagnosis of SARS-CoV-2 by FDA under an Emergency Use Authorization (EUA).  This EUA will remain in effect (meaning this test can be  used) for the duration of  the COVID-19 declaration under Section 564(b)(1) of the Act, 21 U.S.C. section 360bbb-3(b)(1), unless the authorization is terminated or revoked sooner.    Influenza A by PCR NEGATIVE NEGATIVE Final   Influenza B by PCR NEGATIVE NEGATIVE Final    Comment: (NOTE) The Xpert Xpress SARS-CoV-2/FLU/RSV assay is intended as an aid in  the diagnosis of influenza from Nasopharyngeal swab specimens and  should not be used as a sole basis for treatment. Nasal washings and  aspirates are unacceptable for Xpert Xpress SARS-CoV-2/FLU/RSV  testing. Fact Sheet for Patients: https://www.moore.com/ Fact Sheet for Healthcare  Providers: https://www.young.biz/ This test is not yet approved or cleared by the  [place] Architectural technologist and  has been authorized for detection and/or diagnosis of SARS-CoV-2 by  FDA under an Print production planner (EUA). This EUA will remain  in effect (meaning this test can be used) for the duration of the  Covid-19 declaration under Section 564(b)(1) of the Act, 21  U.S.C. section 360bbb-3(b)(1), unless the authorization is  terminated or revoked. Performed at [facility], [place] [address], [place], [place] [address]   Culture, blood (routine x 2)     Status: None   Collection Time: 03/22/20  3:04 PM   Specimen: BLOOD  Result Value Ref Range Status   Specimen Description   Final    BLOOD LEFT ANTECUBITAL Performed at [facility] [address], [place], [place] [address]    Special Requests   Final    BOTTLES DRAWN AEROBIC AND ANAEROBIC Blood Culture results may not be optimal due to an excessive volume of blood received in culture bottles Performed at [facility] [address] [place], [place] [address]    Culture   Final    NO GROWTH 5 DAYS Performed at [facility], [place] [address], [place], [place] [address]    Report Status 03/27/2020 FINAL  Final  Culture, blood (routine x 2)     Status: None   Collection Time: 03/22/20  3:19 PM   Specimen: BLOOD  Result Value Ref Range Status   Specimen Description   Final    BLOOD RIGHT ANTECUBITAL Performed at [facility] [address] [place], [place] [address]    Special Requests   Final    BOTTLES DRAWN AEROBIC AND ANAEROBIC Blood Culture results may not be optimal due to an excessive volume of blood received in culture bottles Performed at [facility] [address], [place], [place] [address]    Culture   Final    NO GROWTH 5 DAYS Performed at [facility], [place] [address], [place],   [address]    Report Status 03/27/2020 FINAL  Final         Radiology Studies: No results found.      Scheduled Meds: . vitamin C  500 mg Oral Daily  . dexamethasone (DECADRON) injection  6 mg Intravenous Q24H  . enoxaparin (LOVENOX) injection  40 mg Subcutaneous Q12H  . feeding supplement (KATE FARMS STANDARD 1.4)  325 mL Oral Q24H  . feeding supplement (PRO-STAT SUGAR FREE 64)  30 mL Oral TID BM  . folic acid  1 mg Oral Daily  . insulin aspart  0-5 Units Subcutaneous QHS  . insulin aspart  0-9 Units Subcutaneous TID WC  . insulin aspart  5 Units Subcutaneous TID WC  . insulin detemir  4 Units Subcutaneous BID  . Ipratropium-Albuterol  1 puff Inhalation Q6H  . lidocaine  1 patch Transdermal Q24H  . multivitamin with minerals  1 tablet Oral Daily  . sodium chloride (PF)      . thiamine  100 mg Oral Daily   Or  . thiamine  100 mg Intravenous Daily  . zinc sulfate  220 mg Oral Daily   Continuous Infusions:    Time spent: 28 minutes with over 50% of the time coordinating the patient's care    Harold Hedge, DO [facility] Pager [phone]  Call night coverage person covering after 7pm

## 2020-03-27 NOTE — Progress Notes (Signed)
PT Cancellation Note  Patient Details Name: David Garrett MRN: [id number] DOB: October 05, 1961   Cancelled Treatment:    Reason Eval/Treat Not Completed: Medical issues which prohibited therapy. D-dimer elevated today.  To r/o PE. Defer PT at this time    [facility] 03/27/2020, 1:58 PM

## 2020-03-27 NOTE — Progress Notes (Signed)
ANTICOAGULATION CONSULT NOTE - Initial Consult  Pharmacy Consult for LMWH Indication: VTE prophylaxis  No Known Allergies  Patient Measurements: Height: 5\' 8"  (172.7 cm) Weight: 55.9 kg (123 lb 3.8 oz) IBW/kg (Calculated) : 68.4 Heparin Dosing Weight:   Vital Signs: Temp: 97.7 F (36.5 C) (04/18 0513) Temp Source: Oral (04/18 0513) BP: 96/60 (04/18 0513) Pulse Rate: 65 (04/18 0513)  Labs: Recent Labs    03/25/20 0437 03/25/20 0437 03/26/20 0412 03/27/20 0431  HGB 11.7*   < > 11.9* 11.0*  HCT 33.2*  --  34.8* 31.9*  PLT 338  --  390 456*  CREATININE 0.52*  --  0.57* 0.49*   < > = values in this interval not displayed.    Estimated Creatinine Clearance: 78.6 mL/min (A) (by C-G formula based on SCr of 0.49 mg/dL (L)).   Medical History: Past Medical History:  Diagnosis Date  . Diabetes mellitus without complication (HCC)     Medications:  Medications Prior to Admission  Medication Sig Dispense Refill Last Dose  . acetaminophen (TYLENOL) 500 MG tablet Take 1,000 mg by mouth every 6 (six) hours as needed for mild pain.   03/22/2020 at Unknown time    Assessment: 59 yo Covid + man with D dimer 5.2.  Pharmacy consulted for VTE px LMWH.  Hg 11. pltc 456, D dimer 5.2, Scr 0.49   Plan:  LMWH 40 mg sq q12h for VTE px in Covid + pt with D dimer > 5  46, Pharm.D [phone] 03/27/2020 7:28 AM

## 2020-03-27 NOTE — Progress Notes (Signed)
SATURATION QUALIFICATIONS: (This note is used to comply with regulatory documentation for home oxygen)  Patient Saturations on Room Air at Rest = 88%  Patient Saturations on Room Air while Ambulating = 86%  Patient Saturations on 3 Liters of oxygen while Ambulating = 95%  Please briefly explain why patient needs home oxygen: 

## 2020-03-27 NOTE — Progress Notes (Signed)
Pt BP was taken and was 86/62 and 90/60. Bodenheimer was paged and made aware of pt vitals. Provider put in orders for a fluid bolus. See new orders.

## 2020-03-28 LAB — BASIC METABOLIC PANEL
Anion gap: 8 (ref 5–15)
BUN: 17 mg/dL (ref 6–20)
CO2: 29 mmol/L (ref 22–32)
Calcium: 8.2 mg/dL — ABNORMAL LOW (ref 8.9–10.3)
Chloride: 100 mmol/L (ref 98–111)
Creatinine, Ser: 0.56 mg/dL — ABNORMAL LOW (ref 0.61–1.24)
GFR calc Af Amer: >60 mL/min (ref >60)
GFR calc non Af Amer: >60 mL/min (ref >60)
Glucose, Bld: 235 mg/dL — ABNORMAL HIGH (ref 70–99)
Potassium: 3.9 mmol/L (ref 3.5–5.1)
Sodium: 137 mmol/L (ref 135–145)

## 2020-03-28 LAB — GLUCOSE, CAPILLARY
Glucose-Capillary: 324 mg/dL — ABNORMAL HIGH (ref 70–99)
Glucose-Capillary: 209 mg/dL — ABNORMAL HIGH (ref 70–99)

## 2020-03-28 LAB — CBC
HCT: 31 % — ABNORMAL LOW (ref 39.0–52.0)
Hemoglobin: 10.6 g/dL — ABNORMAL LOW (ref 13.0–17.0)
MCH: 37.2 pg — ABNORMAL HIGH (ref 26.0–34.0)
MCHC: 34.2 g/dL (ref 30.0–36.0)
MCV: 108.8 fL — ABNORMAL HIGH (ref 80.0–100.0)
Platelets: 449 10*3/uL — ABNORMAL HIGH (ref 150–400)
RBC: 2.85 MIL/uL — ABNORMAL LOW (ref 4.22–5.81)
RDW: 11.7 % (ref 11.5–15.5)
WBC: 4.8 10*3/uL (ref 4.0–10.5)
nRBC: 0 % (ref 0.0–0.2)

## 2020-03-28 LAB — D-DIMER, QUANTITATIVE: D-Dimer, Quant: 2.02 ug/mL-FEU — ABNORMAL HIGH (ref 0.00–0.50)

## 2020-03-28 MED ORDER — IPRATROPIUM-ALBUTEROL 20-100 MCG/ACT IN AERS: 1.0000 | INHALATION_SPRAY | Freq: Four times a day (QID) | RESPIRATORY_TRACT | 0 refills | Status: AC

## 2020-03-28 MED ORDER — HYDROCOD POLST-CPM POLST ER 10-8 MG/5ML PO SUER: 5.0000 mL | Freq: Two times a day (BID) | ORAL | 0 refills | Status: AC | PRN

## 2020-03-28 MED ORDER — SODIUM CHLORIDE 0.9 % IV BOLUS
500.0000 mL | Freq: Once | INTRAVENOUS | Status: AC
  Administered 2020-03-28: 500 mL via INTRAVENOUS

## 2020-03-28 NOTE — Discharge Summary (Signed)
Physician Discharge Summary  David Garrett MIW:[id number] DOB: [birth year]   PCP: System, Pcp Not In  Admit date: 03/22/2020 Discharge date: 03/28/2020 Length of Stay: 6 days   Code Status: Full Code  Admitted From:  Home Discharged to:   [place]:  None  Equipment/Devices:  None Discharge Condition:  Stable  Recommendations for Outpatient Follow-up   1. Follow up with PCP in 1 week 2. Follow up BMP/CBC  3. Patient had hyperglycemia while hospitalized on steroids, steroids DC'd at discharge. Follow up on glucose and diabetes management 4. D dimer elevated but trending down at discharge. CTA chest negative, no signs of DVT. Follow up outpatient.   Hospital Summary  This is a 59 year old male with a history of DM2 diabetic polyneuropathy, hyperlipidemia, alcohol abuse with daily use, macrocytosis, tobacco abuse who presented with chief complaint of fatigue, worsening shortness of breath and cough with diarrhea and recent COVID-19 positive contact with family.  Found to be COVID-19 positive and started on therapy.  4/18: hypotensive overnight, improved with IV fluids. D dimer 0.6->5, CTA chest ordered - negative for PE 4/19: Significant improvement in symptoms tolerating room air at rest and on ambulation.  Asymptomatic hypotension again which resolved with IV fluid bolus.  Patient discharged in stable condition.  He completed 5/5 days remdesivir and 6/10 days of dexamethasone.  Due to the fact that he was tolerating room air at rest and on ambulation along with significant hyperglycemia while on steroids and not on insulin coverage at home, patient was not discharged on a steroid regimen to complete 10 days but needs close follow-up outpatient.  Discharged with antitussives and inhaler.    A & P   Active Problems:   Pneumonia due to COVID-19 virus   Gastroenteritis due to COVID-19 virus   Acute respiratory disease due to COVID-19 virus   Diarrhea   Hyponatremia   Hypochloremia    Hypokalemia   Abnormal LFTs   Macrocytic anemia   Type 2 diabetes mellitus with hyperlipidemia (HCC)   Decreased oral intake   Generalized weakness   Fatigue    1. Acute hypoxic respiratory failure secondary to COVID-19 pneumonia without respiratory distress a. Tolerating room air at rest and on ambulation b. Completed 5 days of remdesivir and 6 out of 10 days dexamethasone.  c. Due to the fact that he was tolerating room air at rest and on ambulation along with significant hyperglycemia while on steroids and not on insulin coverage at home, patient was not discharged on a steroid regimen to complete 10 days but needs close follow-up outpatient.  2. Musculoskeletal left sided Chest pain, possibly costochondritis induced by cough  resolved  3. Hypotension likely from poor p.o. intake Resolved with IV fluids today  4. COVID-19 induced gastroenteritis/diarrhea, resolved  5. Hyponatremia/hypochloremia, resolved with IV fluids  6. Hypokalemia likely from diarrhea, resolved  7. Abnormal LFTs, likely elevated from COVID-19, nearly resolved  8. Macrocytic anemia likely from alcohol use a. Folate and B12 normal b. Outpatient follow-up  9. Alcohol use a. Alcohol level negative b. No concern for withdrawal-did not require CIWA protocol  10. Type 2 diabetes a. HA1c: 7.6 b. Close outpatient follow-up c. Continue home Metformin at discharge  11. Poor p.o. intake in setting of recent tooth extractions and COVID-19 disease a. Follow-up outpatient  12. Generalized weakness and fatigue a. No further PT or OT needed at discharge  13. Hyperlipidemia     Consultants  . None  Procedures  .  None  Antibiotics   Anti-infectives (From admission, onward)   Start     Dose/Rate Route Frequency Ordered Stop   03/23/20 1000  remdesivir 100 mg in sodium chloride 0.9 % 100 mL IVPB     100 mg 200 mL/hr over 30 Minutes Intravenous Daily 03/22/20 1656 03/26/20 0928   03/22/20  1700  remdesivir 100 mg in sodium chloride 0.9 % 100 mL IVPB     100 mg 200 mL/hr over 30 Minutes Intravenous Every 1 hr x 2 03/22/20 1656 03/22/20 2241       Subjective  Patient seen and examined at bedside no acute distress and resting comfortably.  No events overnight.  Tolerating diet. In good spirits and anticipating discharge.   Denies any chest pain, shortness of breath, fever, nausea, vomiting, urinary or bowel complaints. Otherwise ROS negative    Objective   Discharge Exam: Vitals:   03/28/20 0651 03/28/20 1300  BP: (!) 90/56 112/69  Pulse: 67 82  Resp: 18 19  Temp: 98.2 F (36.8 C) 98.2 F (36.8 C)  SpO2: 96% 97%   Vitals:   03/27/20 1948 03/28/20 0346 03/28/20 0651 03/28/20 1300  BP: 106/72  (!) 90/56 112/69  Pulse: 98  67 82  Resp:   18 19  Temp: 97.9 F (36.6 C)  98.2 F (36.8 C) 98.2 F (36.8 C)  TempSrc: Oral  Oral Oral  SpO2:   96% 97%  Weight:  55.6 kg    Height:        Physical Exam Vitals and nursing note reviewed.  Constitutional:      Appearance: Normal appearance.  HENT:     Head: Normocephalic and atraumatic.  Eyes:     Conjunctiva/sclera: Conjunctivae normal.  Cardiovascular:     Rate and Rhythm: Normal rate and regular rhythm.  Pulmonary:     Effort: Pulmonary effort is normal. No tachypnea or accessory muscle usage.  Abdominal:     General: Abdomen is flat.     Palpations: Abdomen is soft.  Musculoskeletal:        General: No swelling or tenderness.  Skin:    Coloration: Skin is not jaundiced or pale.  Neurological:     Mental Status: He is alert. Mental status is at baseline.  Psychiatric:        Mood and Affect: Mood normal.        Behavior: Behavior normal.       The results of significant diagnostics from this hospitalization (including imaging, microbiology, ancillary and laboratory) are listed below for reference.     Microbiology: Recent Results (from the past 240 hour(s))  Respiratory Panel by RT PCR (Flu  A&B, Covid) - Nasopharyngeal Swab     Status: Abnormal   Collection Time: 03/22/20  2:55 PM   Specimen: Nasopharyngeal Swab  Result Value Ref Range Status   SARS Coronavirus 2 by RT PCR POSITIVE (A) NEGATIVE Final    Comment: RESULT CALLED TO, READ BACK BY AND VERIFIED WITH: C.JACKSON AT 2004 ON 03/22/20 BY N.THOMPSON (NOTE) SARS-CoV-2 target nucleic acids are DETECTED. SARS-CoV-2 RNA is generally detectable in upper respiratory specimens  during the acute phase of infection. Positive results are indicative of the presence of the identified virus, but do not rule out bacterial infection or co-infection with other pathogens not detected by the test. Clinical correlation with patient history and other diagnostic information is necessary to determine patient infection status. The expected result is Negative. Fact Sheet for Patients:  PinkCheek.be Fact Sheet for  Healthcare Providers: GravelBags.it This test is not yet approved or cleared by the [place] and  has been authorized for detection and/or diagnosis of SARS-CoV-2 by FDA under an Emergency Use Authorization (EUA).  This EUA will remain in effect (meaning this test can be  used) for the duration of  the COVID-19 declaration under Section 564(b)(1) of the Act, 21 U.S.C. section 360bbb-3(b)(1), unless the authorization is terminated or revoked sooner.    Influenza A by PCR NEGATIVE NEGATIVE Final   Influenza B by PCR NEGATIVE NEGATIVE Final    Comment: (NOTE) The Xpert Xpress SARS-CoV-2/FLU/RSV assay is intended as an aid in  the diagnosis of influenza from Nasopharyngeal swab specimens and  should not be used as a sole basis for treatment. Nasal washings and  aspirates are unacceptable for Xpert Xpress SARS-CoV-2/FLU/RSV  testing. Fact Sheet for Patients: PinkCheek.be Fact Sheet for Healthcare  Providers: GravelBags.it This test is not yet approved or cleared by the [place] FDA and  has been authorized for detection and/or diagnosis of SARS-CoV-2 by  FDA under an Emergency Use Authorization (EUA). This EUA will remain  in effect (meaning this test can be used) for the duration of the  Covid-19 declaration under Section 564(b)(1) of the Act, 21  U.S.C. section 360bbb-3(b)(1), unless the authorization is  terminated or revoked. Performed at [facility], [place] [address], [place], [place] [address]   Culture, blood (routine x 2)     Status: None   Collection Time: 03/22/20  3:04 PM   Specimen: BLOOD  Result Value Ref Range Status   Specimen Description   Final    BLOOD LEFT ANTECUBITAL Performed at [facility] [address] [place], [place] [address]    Special Requests   Final    BOTTLES DRAWN AEROBIC AND ANAEROBIC Blood Culture results may not be optimal due to an excessive volume of blood received in culture bottles Performed at [facility] [address], [place], [place] [address]    Culture   Final    NO GROWTH 5 DAYS Performed at [facility], [place] [address] [place], [place] [address]    Report Status 03/27/2020 FINAL  Final  Culture, blood (routine x 2)     Status: None   Collection Time: 03/22/20  3:19 PM   Specimen: BLOOD  Result Value Ref Range Status   Specimen Description   Final    BLOOD RIGHT ANTECUBITAL Performed at [facility] [address] [place], [place] [address]    Special Requests   Final    BOTTLES DRAWN AEROBIC AND ANAEROBIC Blood Culture results may not be optimal due to an excessive volume of blood received in culture bottles Performed at [facility] [address], [place], [place] [address]    Culture   Final    NO GROWTH 5 DAYS Performed at [facility], [place] [address] [place],  [place] [address]    Report Status 03/27/2020 FINAL  Final     Labs: BNP (last 3 results) No results for input(s): BNP in the last 8760 hours. Basic Metabolic Panel: Recent Labs  Lab 03/23/20 0407 03/23/20 0407 03/24/20 0353 03/25/20 0437 03/26/20 0412 03/27/20 0431 03/28/20 0400  NA 137   < > 136 136 135 136 137  K 3.7   < > 4.1 4.1 4.1 4.0 3.9  CL 102   < > 100 100 97* 99 100  CO2  27   < > _0 GLUCOSE 176*   < > 218* 226* 228* 234* 235*  BUN 9   < > _1 CREATININE 0.46*   < > 0.45* 0.52* 0.57* 0.49* 0.56*  CALCIUM 8.1*   < > 8.5* 8.6* 8.4* 8.0* 8.2*  MG 2.0  --  2.2 2.1 2.2 2.2  --   PHOS 2.8  --  3.6 3.8 5.1* 4.3  --    < > = values in this interval not displayed.   Liver Function Tests: Recent Labs  Lab 03/23/20 0407 03/24/20 0353 03/25/20 0437 03/26/20 0412 03/27/20 0431  AST 120* 126* 65* 42* 25  ALT 87* 126* 106* 82* 59*  ALKPHOS 69 77 70 72 67  BILITOT 1.4* 1.2 1.2 1.2 1.1  PROT 6.2* 6.5 6.3* 6.3* 6.1*  ALBUMIN 3.0* 3.0* 2.9* 2.9* 2.7*   No results for input(s): LIPASE, AMYLASE in the last 168 hours. No results for input(s): AMMONIA in the last 168 hours. CBC: Recent Labs  Lab 03/23/20 0407 03/23/20 0407 03/24/20 0353 03/25/20 0437 03/26/20 0412 03/27/20 0431 03/28/20 0400  WBC 3.8*   < > 2.8* 6.0 5.4 4.2 4.8  NEUTROABS 2.1  --  1.6* 4.3 3.8 3.0  --   HGB 11.9*   < > 11.9* 11.7* 11.9* 11.0* 10.6*  HCT 33.0*   < > 34.0* 33.2* 34.8* 31.9* 31.0*  MCV 107.5*   < > 108.3* 107.1* 108.8* 108.1* 108.8*  PLT 217   < > 271 338 390 456* 449*   < > = values in this interval not displayed.   Cardiac Enzymes: No results for input(s): CKTOTAL, CKMB, CKMBINDEX, TROPONINI in the last 168 hours. BNP: Invalid input(s): POCBNP CBG: Recent Labs  Lab 03/27/20 1148 03/27/20 1619 03/27/20 2101 03/28/20 0831 03/28/20 1156  GLUCAP 214* 178* 221* 324* 209*   D-Dimer Recent Labs    03/27/20 0431 03/28/20 0811  DDIMER 5.20* 2.02*   Hgb  A1c No results for input(s): HGBA1C in the last 72 hours. Lipid Profile No results for input(s): CHOL, HDL, LDLCALC, TRIG, CHOLHDL, LDLDIRECT in the last 72 hours. Thyroid function studies No results for input(s): TSH, T4TOTAL, T3FREE, THYROIDAB in the last 72 hours.  Invalid input(s): FREET3 Anemia work up Recent Labs    03/26/20 0412 03/27/20 0431  FERRITIN 2,663* 1,901*   Urinalysis    Component Value Date/Time   COLORURINE YELLOW 03/22/2020 2100   APPEARANCEUR CLEAR 03/22/2020 2100   LABSPEC 1.014 03/22/2020 2100   PHURINE 7.0 03/22/2020 2100   GLUCOSEU >=500 (A) 03/22/2020 2100   HGBUR NEGATIVE 03/22/2020 2100   BILIRUBINUR NEGATIVE 03/22/2020 2100   KETONESUR 80 (A) 03/22/2020 2100   PROTEINUR 100 (A) 03/22/2020 2100   NITRITE NEGATIVE 03/22/2020 2100   LEUKOCYTESUR NEGATIVE 03/22/2020 2100   Sepsis Labs Invalid input(s): PROCALCITONIN,  WBC,  LACTICIDVEN Microbiology Recent Results (from the past 240 hour(s))  Respiratory Panel by RT PCR (Flu A&B, Covid) - Nasopharyngeal Swab     Status: Abnormal   Collection Time: 03/22/20  2:55 PM   Specimen: Nasopharyngeal Swab  Result Value Ref Range Status   SARS Coronavirus 2 by RT PCR POSITIVE (A) NEGATIVE Final    Comment: RESULT CALLED TO, READ BACK BY AND VERIFIED WITH: C.JACKSON AT 2004 ON 03/22/20 BY N.THOMPSON (NOTE) SARS-CoV-2 target nucleic acids are DETECTED. SARS-CoV-2 RNA is generally detectable in upper respiratory specimens  during the acute phase of infection. Positive results  are indicative of the presence of the identified virus, but do not rule out bacterial infection or co-infection with other pathogens not detected by the test. Clinical correlation with patient history and other diagnostic information is necessary to determine patient infection status. The expected result is Negative. Fact Sheet for Patients:  PinkCheek.be Fact Sheet for Healthcare  Providers: GravelBags.it This test is not yet approved or cleared by the [place] FDA and  has been authorized for detection and/or diagnosis of SARS-CoV-2 by FDA under an Emergency Use Authorization (EUA).  This EUA will remain in effect (meaning this test can be  used) for the duration of  the COVID-19 declaration under Section 564(b)(1) of the Act, 21 U.S.C. section 360bbb-3(b)(1), unless the authorization is terminated or revoked sooner.    Influenza A by PCR NEGATIVE NEGATIVE Final   Influenza B by PCR NEGATIVE NEGATIVE Final    Comment: (NOTE) The Xpert Xpress SARS-CoV-2/FLU/RSV assay is intended as an aid in  the diagnosis of influenza from Nasopharyngeal swab specimens and  should not be used as a sole basis for treatment. Nasal washings and  aspirates are unacceptable for Xpert Xpress SARS-CoV-2/FLU/RSV  testing. Fact Sheet for Patients: PinkCheek.be Fact Sheet for Healthcare Providers: GravelBags.it This test is not yet approved or cleared by the [place] FDA and  has been authorized for detection and/or diagnosis of SARS-CoV-2 by  FDA under an Emergency Use Authorization (EUA). This EUA will remain  in effect (meaning this test can be used) for the duration of the  Covid-19 declaration under Section 564(b)(1) of the Act, 21  U.S.C. section 360bbb-3(b)(1), unless the authorization is  terminated or revoked. Performed at [facility], [place] [address] [place], [place] [address]   Culture, blood (routine x 2)     Status: None   Collection Time: 03/22/20  3:04 PM   Specimen: BLOOD  Result Value Ref Range Status   Specimen Description   Final    BLOOD LEFT ANTECUBITAL Performed at [facility] [address] [place], [place] [address]    Special Requests   Final    BOTTLES DRAWN AEROBIC AND ANAEROBIC Blood Culture results may not be  optimal due to an excessive volume of blood received in culture bottles Performed at [facility] [address] [place], [place] [address]    Culture   Final    NO GROWTH 5 DAYS Performed at [facility], [place] [address], [place], [place] [address]    Report Status 03/27/2020 FINAL  Final  Culture, blood (routine x 2)     Status: None   Collection Time: 03/22/20  3:19 PM   Specimen: BLOOD  Result Value Ref Range Status   Specimen Description   Final    BLOOD RIGHT ANTECUBITAL Performed at [facility] [address], [place], [place] [address]    Special Requests   Final    BOTTLES DRAWN AEROBIC AND ANAEROBIC Blood Culture results may not be optimal due to an excessive volume of blood received in culture bottles Performed at [facility] [address], [place], [place] [address]    Culture   Final    NO GROWTH 5 DAYS Performed at [facility], [place] [address] [place],  [address]    Report Status 03/27/2020 FINAL  Final    Discharge Instructions     Discharge Instructions    Diet - low sodium heart healthy  Complete by: As directed    Discharge instructions   Complete by: As directed    You were seen in the hospital for COVID-19.  Upon discharge: - Use Tussionex as needed every 12 hours for coughing - Use Combivent inhaler every 6 hours - Follow up with your primary care physician in one week with lab work -You are still contagious with COVID-19 and should self isolate at home.  Isolation can be discontinued when the following criteria are met:  At least 10 days have passed since symptoms first appeared AND You are at least 1 day (24 hours) since resolution of fever without the use of any fever reducing medications (Tylenol, Motrin, ibuprofen, etc.) AND There is improvement in your symptoms (ex. shortness of breath, cough, etc.)  If you have any questions do not hesitate to contact your  primary care physician or return to the ED if worsening symptoms.   Increase activity slowly   Complete by: As directed      Allergies as of 03/28/2020   No Known Allergies     Medication List    TAKE these medications   acetaminophen 500 MG tablet Commonly known as: TYLENOL Take 1,000 mg by mouth every 6 (six) hours as needed for mild pain.   chlorpheniramine-HYDROcodone 10-8 MG/5ML Suer Commonly known as: TUSSIONEX Take 5 mLs by mouth every 12 (twelve) hours as needed for cough.   Ipratropium-Albuterol 20-100 MCG/ACT Aers respimat Commonly known as: COMBIVENT Inhale 1 puff into the lungs every 6 (six) hours.       No Known Allergies  Dispo: The patient is from: Home              Anticipated d/c is to: Home              Anticipated d/c date is: today              Patient currently is medically stable to d/c.        Time coordinating discharge: Over 30 minutes   SIGNED:   Harold Hedge, D.O. [facility] Pager: [phone]  03/28/2020, 3:11 PM

## 2020-03-28 NOTE — Discharge Instructions (Signed)
10 Things You Can Do to Manage Your COVID-19 Symptoms at Home If you have possible or confirmed COVID-19: 1. Stay home from work and school. And stay away from other public places. If you must go out, avoid using any kind of public transportation, ridesharing, or taxis. 2. Monitor your symptoms carefully. If your symptoms get worse, call your healthcare provider immediately. 3. Get rest and stay hydrated. 4. If you have a medical appointment, call the healthcare provider ahead of time and tell them that you have or may have COVID-19. 5. For medical emergencies, call 911 and notify the dispatch personnel that you have or may have COVID-19. 6. Cover your cough and sneezes with a tissue or use the inside of your elbow. 7. Wash your hands often with soap and water for at least 20 seconds or clean your hands with an alcohol-based hand sanitizer that contains at least 60% alcohol. 8. As much as possible, stay in a specific room and away from other people in your home. Also, you should use a separate bathroom, if available. If you need to be around other people in or outside of the home, wear a mask. 9. Avoid sharing personal items with other people in your household, like dishes, towels, and bedding. 10. Clean all surfaces that are touched often, like counters, tabletops, and doorknobs. Use household cleaning sprays or wipes according to the label instructions. cdc.gov/coronavirus 06/10/2019 This information is not intended to replace advice given to you by your health care provider. Make sure you discuss any questions you have with your health care provider. Document Revised: 11/12/2019 Document Reviewed: 11/12/2019 Elsevier Patient Education  2020 [employer].   COVID-19 COVID-19 is a respiratory infection that is caused by a virus called severe acute respiratory syndrome coronavirus 2 (SARS-CoV-2). The disease is also known as coronavirus disease or novel coronavirus. In some people, the virus may  not cause any symptoms. In others, it may cause a serious infection. The infection can get worse quickly and can lead to complications, such as:  Pneumonia, or infection of the lungs.  Acute respiratory distress syndrome or ARDS. This is a condition in which fluid build-up in the lungs prevents the lungs from filling with air and passing oxygen into the blood.  Acute respiratory failure. This is a condition in which there is not enough oxygen passing from the lungs to the body or when carbon dioxide is not passing from the lungs out of the body.  Sepsis or septic shock. This is a serious bodily reaction to an infection.  Blood clotting problems.  Secondary infections due to bacteria or fungus.  Organ failure. This is when your body's organs stop working. The virus that causes COVID-19 is contagious. This means that it can spread from person to person through droplets from coughs and sneezes (respiratory secretions). What are the causes? This illness is caused by a virus. You may catch the virus by:  Breathing in droplets from an infected person. Droplets can be spread by a person breathing, speaking, singing, coughing, or sneezing.  Touching something, like a table or a doorknob, that was exposed to the virus (contaminated) and then touching your mouth, nose, or eyes. What increases the risk? Risk for infection You are more likely to be infected with this virus if you:  Are within 6 feet (2 meters) of a person with COVID-19.  Provide care for or live with a person who is infected with COVID-19.  Spend time in crowded indoor spaces or   live in shared housing. Risk for serious illness You are more likely to become seriously ill from the virus if you:  Are 50 years of age or older. The higher your age, the more you are at risk for serious illness.  Live in a nursing home or long-term care facility.  Have cancer.  Have a long-term (chronic) disease such as: ? Chronic lung disease,  including chronic obstructive pulmonary disease or asthma. ? A long-term disease that lowers your body's ability to fight infection (immunocompromised). ? Heart disease, including heart failure, a condition in which the arteries that lead to the heart become narrow or blocked (coronary artery disease), a disease which makes the heart muscle thick, weak, or stiff (cardiomyopathy). ? Diabetes. ? Chronic kidney disease. ? Sickle cell disease, a condition in which red blood cells have an abnormal "sickle" shape. ? Liver disease.  Are obese. What are the signs or symptoms? Symptoms of this condition can range from mild to severe. Symptoms may appear any time from 2 to 14 days after being exposed to the virus. They include:  A fever or chills.  A cough.  Difficulty breathing.  Headaches, body aches, or muscle aches.  Runny or stuffy (congested) nose.  A sore throat.  New loss of taste or smell. Some people may also have stomach problems, such as nausea, vomiting, or diarrhea. Other people may not have any symptoms of COVID-19. How is this diagnosed? This condition may be diagnosed based on:  Your signs and symptoms, especially if: ? You live in an area with a COVID-19 outbreak. ? You recently traveled to or from an area where the virus is common. ? You provide care for or live with a person who was diagnosed with COVID-19. ? You were exposed to a person who was diagnosed with COVID-19.  A physical exam.  Lab tests, which may include: ? Taking a sample of fluid from the back of your nose and throat (nasopharyngeal fluid), your nose, or your throat using a swab. ? A sample of mucus from your lungs (sputum). ? Blood tests.  Imaging tests, which may include, X-rays, CT scan, or ultrasound. How is this treated? At present, there is no medicine to treat COVID-19. Medicines that treat other diseases are being used on a trial basis to see if they are effective against COVID-19. Your  health care provider will talk with you about ways to treat your symptoms. For most people, the infection is mild and can be managed at home with rest, fluids, and over-the-counter medicines. Treatment for a serious infection usually takes places in a hospital intensive care unit (ICU). It may include one or more of the following treatments. These treatments are given until your symptoms improve.  Receiving fluids and medicines through an IV.  Supplemental oxygen. Extra oxygen is given through a tube in the nose, a face mask, or a hood.  Positioning you to lie on your stomach (prone position). This makes it easier for oxygen to get into the lungs.  Continuous positive airway pressure (CPAP) or bi-level positive airway pressure (BPAP) machine. This treatment uses mild air pressure to keep the airways open. A tube that is connected to a motor delivers oxygen to the body.  Ventilator. This treatment moves air into and out of the lungs by using a tube that is placed in your windpipe.  Tracheostomy. This is a procedure to create a hole in the neck so that a breathing tube can be inserted.  Extracorporeal membrane   oxygenation (ECMO). This procedure gives the lungs a chance to recover by taking over the functions of the heart and lungs. It supplies oxygen to the body and removes carbon dioxide. Follow these instructions at home: Lifestyle  If you are sick, stay home except to get medical care. Your health care provider will tell you how long to stay home. Call your health care provider before you go for medical care.  Rest at home as told by your health care provider.  Do not use any products that contain nicotine or tobacco, such as cigarettes, e-cigarettes, and chewing tobacco. If you need help quitting, ask your health care provider.  Return to your normal activities as told by your health care provider. Ask your health care provider what activities are safe for you. General  instructions  Take over-the-counter and prescription medicines only as told by your health care provider.  Drink enough fluid to keep your urine pale yellow.  Keep all follow-up visits as told by your health care provider. This is important. How is this prevented?  There is no vaccine to help prevent COVID-19 infection. However, there are steps you can take to protect yourself and others from this virus. To protect yourself:   Do not travel to areas where COVID-19 is a risk. The areas where COVID-19 is reported change often. To identify high-risk areas and travel restrictions, check the CDC travel website: wwwnc.cdc.gov/travel/notices  If you live in, or must travel to, an area where COVID-19 is a risk, take precautions to avoid infection. ? Stay away from people who are sick. ? Wash your hands often with soap and water for 20 seconds. If soap and water are not available, use an alcohol-based hand sanitizer. ? Avoid touching your mouth, face, eyes, or nose. ? Avoid going out in public, follow guidance from your state and local health authorities. ? If you must go out in public, wear a cloth face covering or face mask. Make sure your mask covers your nose and mouth. ? Avoid crowded indoor spaces. Stay at least 6 feet (2 meters) away from others. ? Disinfect objects and surfaces that are frequently touched every day. This may include:  Counters and tables.  Doorknobs and light switches.  Sinks and faucets.  Electronics, such as phones, remote controls, keyboards, computers, and tablets. To protect others: If you have symptoms of COVID-19, take steps to prevent the virus from spreading to others.  If you think you have a COVID-19 infection, contact your health care provider right away. Tell your health care team that you think you may have a COVID-19 infection.  Stay home. Leave your house only to seek medical care. Do not use public transport.  Do not travel while you are  sick.  Wash your hands often with soap and water for 20 seconds. If soap and water are not available, use alcohol-based hand sanitizer.  Stay away from other members of your household. Let healthy household members care for children and pets, if possible. If you have to care for children or pets, wash your hands often and wear a mask. If possible, stay in your own room, separate from others. Use a different bathroom.  Make sure that all people in your household wash their hands well and often.  Cough or sneeze into a tissue or your sleeve or elbow. Do not cough or sneeze into your hand or into the air.  Wear a cloth face covering or face mask. Make sure your mask covers your nose   and mouth. Where to find more information  Centers for Disease Control and Prevention: PurpleGadgets.be  World Health Organization: https://www.castaneda.info/ Contact a health care provider if:  You live in or have traveled to an area where COVID-19 is a risk and you have symptoms of the infection.  You have had contact with someone who has COVID-19 and you have symptoms of the infection. Get help right away if:  You have trouble breathing.  You have pain or pressure in your chest.  You have confusion.  You have bluish lips and fingernails.  You have difficulty waking from sleep.  You have symptoms that get worse. These symptoms may represent a serious problem that is an emergency. Do not wait to see if the symptoms will go away. Get medical help right away. Call your local emergency services (911 in the [place] Do not drive yourself to the hospital. Let the emergency medical personnel know if you think you have COVID-19. Summary  COVID-19 is a respiratory infection that is caused by a virus. It is also known as coronavirus disease or novel coronavirus. It can cause serious infections, such as pneumonia, acute respiratory distress syndrome, acute respiratory failure,  or sepsis.  The virus that causes COVID-19 is contagious. This means that it can spread from person to person through droplets from breathing, speaking, singing, coughing, or sneezing.  You are more likely to develop a serious illness if you are 56 years of age or older, have a weak immune system, live in a nursing home, or have chronic disease.  There is no medicine to treat COVID-19. Your health care provider will talk with you about ways to treat your symptoms.  Take steps to protect yourself and others from infection. Wash your hands often and disinfect objects and surfaces that are frequently touched every day. Stay away from people who are sick and wear a mask if you are sick. This information is not intended to replace advice given to you by your health care provider. Make sure you discuss any questions you have with your health care provider. Document Revised: 09/25/2019 Document Reviewed: 01/01/2019 Elsevier Patient Education  2020 [place].  COVID-19: How to Protect Yourself and Others Know how it spreads  There is currently no vaccine to prevent coronavirus disease 2019 (COVID-19).  The best way to prevent illness is to avoid being exposed to this virus.  The virus is thought to spread mainly from person-to-person. ? Between people who are in close contact with one another (within about 6 feet). ? Through respiratory droplets produced when an infected person coughs, sneezes or talks. ? These droplets can land in the mouths or noses of people who are nearby or possibly be inhaled into the lungs. ? COVID-19 may be spread by people who are not showing symptoms. Everyone should Clean your hands often  Wash your hands often with soap and water for at least 20 seconds especially after you have been in a public place, or after blowing your nose, coughing, or sneezing.  If soap and water are not readily available, use a hand sanitizer that contains at least 60% alcohol. Cover  all surfaces of your hands and rub them together until they feel dry.  Avoid touching your eyes, nose, and mouth with unwashed hands. Avoid close contact  Limit contact with others as much as possible.  Avoid close contact with people who are sick.  Put distance between yourself and other people. ? Remember that some people without symptoms may be  able to spread virus. ? This is especially important for people who are at higher risk of getting very GainPain.com.cy Cover your mouth and nose with a mask when around others  You could spread COVID-19 to others even if you do not feel sick.  Everyone should wear a mask in public settings and when around people not living in their household, especially when social distancing is difficult to maintain. ? Masks should not be placed on young children under age 61, anyone who has trouble breathing, or is unconscious, incapacitated or otherwise unable to remove the mask without assistance.  The mask is meant to protect other people in case you are infected.  Do NOT use a facemask meant for a Dietitian.  Continue to keep about 6 feet between yourself and others. The mask is not a substitute for social distancing. Cover coughs and sneezes  Always cover your mouth and nose with a tissue when you cough or sneeze or use the inside of your elbow.  Throw used tissues in the trash.  Immediately wash your hands with soap and water for at least 20 seconds. If soap and water are not readily available, clean your hands with a hand sanitizer that contains at least 60% alcohol. Clean and disinfect  Clean AND disinfect frequently touched surfaces daily. This includes tables, doorknobs, light switches, countertops, handles, desks, phones, keyboards, toilets, faucets, and sinks. RackRewards.[email or web address]  If surfaces are  dirty, clean them: Use detergent or soap and water prior to disinfection.  Then, use a household disinfectant. You can see a list of EPA-registered household disinfectants here. [email or web address] 08/12/2019 This information is not intended to replace advice given to you by your health care provider. Make sure you discuss any questions you have with your health care provider. Document Revised: 08/20/2019 Document Reviewed: 06/18/2019 Elsevier Patient Education  [place].  3 Key Steps to Take While Waiting for Your COVID-19 Test Result To help stop the spread of COVID-19, take these 3 key steps NOW while waiting for your test results: 1. Stay home and monitor your health. Stay home and monitor your health to help protect your friends, family, and others from possibly getting COVID-19 from you. Stay home and away from others:  If possible, stay away from others, especially people who are at higher risk for getting very sick from COVID-19, such as older adults and people with other medical conditions.  If you have been in contact with someone with COVID-19, stay home and away from others for 14 days after your last contact with that person.  If you have a fever, cough or other symptoms of COVID-19, stay home and away from others (except to get medical care). Monitor your health:  Watch for fever, cough, shortness of breath, or other symptoms of COVID-19. Remember, symptoms may appear 2-14 days after exposure to COVID-19 and can include: ? Fever or chills ? Cough ? Shortness of breath or difficulty breathing ? Tiredness ? Muscle or body aches ? Headache ? New loss of taste or smell ? Sore throat ? Congestion or runny nose ? Nausea or vomiting ? Diarrhea 2. Think about the people you have recently been around. If you are diagnosed with [id number], a public health worker may call you to check on your health, discuss who you have been around, and ask where you spent time while  you may have been able to spread COVID-19 to others. While you wait for your COVID-19 test result, think about everyone you have  been around recently. This will be important information to give health workers if your test is positive.  Complete the information on the back of this page to help you remember everyone you have been around. 3. Answer the phone call from the health department. If a public health worker calls you, answer the call to help slow the spread of COVID-19 in your community.  Discussions with health department staff are confidential. This means that your personal and medical information will be kept private and only shared with those who may need to know, like your health care provider.  Your name will not be shared with those you came in contact with. The health department will only notify people you were in close contact with (within 6 feet for more than 15 minutes) that they might have been exposed to COVID-19. Think about the people you have recently been around If you test positive and are diagnosed with COVID-19, someone from the health department may call to check-in on your health, discuss who you have been around, and ask where you spent time while you may have been able to spread COVID-19 to others. This form can help you think about people you have recently been around so you will be ready if a public health worker calls you. Things to think about. Have you:  Gone to work or school?  Gotten together with others (eaten out at [employer], gone out for drinks, exercised with others or gone to a gym, had friends or family over to your house, volunteered, gone to a party, pool, or park)?  Gone to a store in person (e.g., grocery store, mall)?  Gone to in-person appointments (e.g., salon, barber, doctor's or dentist's office)?  Ridden in a car with others (e.g., [employer] or [employer]) or took public transportation?  Been inside a church, synagogue, mosque or other places of  worship? Who lives with you?  ______________________________________________________________________  ______________________________________________________________________  ______________________________________________________________________  ______________________________________________________________________ Who have you been around (within 6 feet for more than 15 minutes) in the last 10 days? (You may have more people to list than the space provided. If so, write on the front of this sheet or a separate piece of paper.) Name ______________________________________________  Phone number ____________________________________  Date you last saw them _____________________________  Where you last saw them ________________________________________________ Name ______________________________________________  Phone number ____________________________________  Date you last saw them _____________________________  Where you last saw them ________________________________________________ Name ______________________________________________  Phone number ____________________________________  Date you last saw them _____________________________  Where you last saw them ________________________________________________ Name ______________________________________________  Phone number ____________________________________  Date you last saw them _____________________________  Where you last saw them ________________________________________________ Name ______________________________________________  Phone number ____________________________________  Date you last saw them _____________________________  Where you last saw them ________________________________________________ What have you done in the last 10 days with other people? Activity _____________________________________________  Location _________________________________________  Date  ____________________________________________ Activity _____________________________________________  Location _________________________________________  Date ____________________________________________ Activity _____________________________________________  Location _________________________________________  Date ____________________________________________ Activity _____________________________________________  Location _________________________________________  Date ____________________________________________ Activity _____________________________________________  Location _________________________________________  Date ____________________________________________ [email or web address] 07/06/2019 This information is not intended to replace advice given to you by your health care provider. Make sure you discuss any questions you have with your health care provider. Document Revised: 11/12/2019 Document Reviewed: 11/12/2019 Elsevier Patient Education  [place].   COVID-19 Frequently Asked Questions COVID-19 (coronavirus disease) is an infection that is caused by a large family of viruses. Some viruses cause illness  in people and others cause illness in animals like camels, cats, and bats. In some cases, the viruses that cause illness in animals can spread to humans. Where did the coronavirus come from? In December 2019, [place] told the [facility]) of several cases of lung disease (human respiratory illness). These cases were linked to an open seafood and livestock market in the city of [place]. The link to the seafood and livestock market suggests that the virus may have spread from animals to humans. However, since that first outbreak in December, the virus has also been shown to spread from person to person. What is the name of the disease and the virus? Disease name Early on, this disease was called novel coronavirus. This is because scientists  determined that the disease was caused by a new (novel) respiratory virus. The World Health Organization [facility]) has now named the disease COVID-19, or coronavirus disease. Virus name The virus that causes the disease is called severe acute respiratory syndrome coronavirus 2 (SARS-CoV-2). More information on disease and virus naming World Health Organization [facility]): www.who.int/emergencies/diseases/novel-coronavirus-2019/technical-guidance/naming-the-coronavirus-disease-(covid-2019)-and-the-virus-that-causes-it Who is at risk for complications from coronavirus disease? Some people may be at higher risk for complications from coronavirus disease. This includes older adults and people who have chronic diseases, such as heart disease, diabetes, and lung disease. If you are at higher risk for complications, take these extra precautions:  Stay home as much as possible.  Avoid social gatherings and travel.  Avoid close contact with others. Stay at least 6 ft (2 m) away from others, if possible.  Wash your hands often with soap and water for at least 20 seconds.  Avoid touching your face, mouth, nose, or eyes.  Keep supplies on hand at home, such as food, medicine, and cleaning supplies.  If you must go out in public, wear a cloth face covering or face mask. Make sure your mask covers your nose and mouth. How does coronavirus disease spread? The virus that causes coronavirus disease spreads easily from person to person (is contagious). You may catch the virus by:  Breathing in droplets from an infected person. Droplets can be spread by a person breathing, speaking, singing, coughing, or sneezing.  Touching something, like a table or a doorknob, that was exposed to the virus (contaminated) and then touching your mouth, nose, or eyes. Can I get the virus from touching surfaces or objects? There is still a lot that we do not know about the virus that causes coronavirus disease. Scientists are basing  a lot of information on what they know about similar viruses, such as:  Viruses cannot generally survive on surfaces for long. They need a human body (host) to survive.  It is more likely that the virus is spread by close contact with people who are sick (direct contact), such as through: ? Shaking hands or hugging. ? Breathing in respiratory droplets that travel through the air. Droplets can be spread by a person breathing, speaking, singing, coughing, or sneezing.  It is less likely that the virus is spread when a person touches a surface or object that has the virus on it (indirect contact). The virus may be able to enter the body if the person touches a surface or object and then touches his or her face, eyes, nose, or mouth. Can a person spread the virus without having symptoms of the disease? It may be possible for the virus to spread before a person has symptoms of the disease, but this is most likely  not the main way the virus is spreading. It is more likely for the virus to spread by being in close contact with people who are sick and breathing in the respiratory droplets spread by a person breathing, speaking, singing, coughing, or sneezing. What are the symptoms of coronavirus disease? Symptoms vary from person to person and can range from mild to severe. Symptoms may include:  Fever or chills.  Cough.  Difficulty breathing or feeling short of breath.  Headaches, body aches, or muscle aches.  Runny or stuffy (congested) nose.  Sore throat.  New loss of taste or smell.  Nausea, vomiting, or diarrhea. These symptoms can appear anywhere from 2 to 14 days after you have been exposed to the virus. Some people may not have any symptoms. If you develop symptoms, call your health care provider. People with severe symptoms may need hospital care. Should I be tested for this virus? Your health care provider will decide whether to test you based on your symptoms, history of exposure,  and your risk factors. How does a health care provider test for this virus? Health care providers will collect samples to send for testing. Samples may include:  Taking a swab of fluid from the back of your nose and throat, your nose, or your throat.  Taking fluid from the lungs by having you cough up mucus (sputum) into a sterile cup.  Taking a blood sample. Is there a treatment or vaccine for this virus? Currently, there is no vaccine to prevent coronavirus disease. Also, there are no medicines like antibiotics or antivirals to treat the virus. A person who becomes sick is given supportive care, which means rest and fluids. A person may also relieve his or her symptoms by using over-the-counter medicines that treat sneezing, coughing, and runny nose. These are the same medicines that a person takes for the common cold. If you develop symptoms, call your health care provider. People with severe symptoms may need hospital care. What can I do to protect myself and my family from this virus?     You can protect yourself and your family by taking the same actions that you would take to prevent the spread of other viruses. Take the following actions:  Wash your hands often with soap and water for at least 20 seconds. If soap and water are not available, use alcohol-based hand sanitizer.  Avoid touching your face, mouth, nose, or eyes.  Cough or sneeze into a tissue, sleeve, or elbow. Do not cough or sneeze into your hand or the air. ? If you cough or sneeze into a tissue, throw it away immediately and wash your hands.  Disinfect objects and surfaces that you frequently touch every day.  Stay away from people who are sick.  Avoid going out in public, follow guidance from your state and local health authorities.  Avoid crowded indoor spaces. Stay at least 6 ft (2 m) away from others.  If you must go out in public, wear a cloth face covering or face mask. Make sure your mask covers your  nose and mouth.  Stay home if you are sick, except to get medical care. Call your health care provider before you get medical care. Your health care provider will tell you how long to stay home.  Make sure your vaccines are up to date. Ask your health care provider what vaccines you need. What should I do if I need to travel? Follow travel recommendations from your local health authority, the CDC,  and WHO. Travel information and advice  Centers for Disease Control and Prevention (CDC): BodyEditor.hu  World Health Organization [facility]): ThirdIncome.ca Know the risks and take action to protect your health  You are at higher risk of getting coronavirus disease if you are traveling to areas with an outbreak or if you are exposed to travelers from areas with an outbreak.  Wash your hands often and practice good hygiene to lower the risk of catching or spreading the virus. What should I do if I am sick? General instructions to stop the spread of infection  Wash your hands often with soap and water for at least 20 seconds. If soap and water are not available, use alcohol-based hand sanitizer.  Cough or sneeze into a tissue, sleeve, or elbow. Do not cough or sneeze into your hand or the air.  If you cough or sneeze into a tissue, throw it away immediately and wash your hands.  Stay home unless you must get medical care. Call your health care provider or local health authority before you get medical care.  Avoid public areas. Do not take public transportation, if possible.  If you can, wear a mask if you must go out of the house or if you are in close contact with someone who is not sick. Make sure your mask covers your nose and mouth. Keep your home clean  Disinfect objects and surfaces that are frequently touched every day. This may include: ? Counters and tables. ? Doorknobs and light  switches. ? Sinks and faucets. ? Electronics such as phones, remote controls, keyboards, computers, and tablets.  Wash dishes in hot, soapy water or use a dishwasher. Air-dry your dishes.  Wash laundry in hot water. Prevent infecting other household members  Let healthy household members care for children and pets, if possible. If you have to care for children or pets, wash your hands often and wear a mask.  Sleep in a different bedroom or bed, if possible.  Do not share personal items, such as razors, toothbrushes, deodorant, combs, brushes, towels, and washcloths. Where to find more information Centers for Disease Control and Prevention (CDC)  Information and news updates: https://www.butler-gonzalez.com/ World Health Organization [facility])  Information and news updates: MissExecutive.com.ee  Coronavirus health topic: https://www.castaneda.info/  Questions and answers on COVID-19: OpportunityDebt.at  Global tracker: who.sprinklr.com American Academy of Pediatrics (AAP)  Information for families: www.healthychildren.org/English/health-issues/conditions/chest-lungs/Pages/2019-Novel-Coronavirus.aspx The coronavirus situation is changing rapidly. Check your local health authority website or the CDC and [facility] websites for updates and news. When should I contact a health care provider?  Contact your health care provider if you have symptoms of an infection, such as fever or cough, and you: ? Have been near anyone who is known to have coronavirus disease. ? Have come into contact with a person who is suspected to have coronavirus disease. ? Have traveled to an area where there is an outbreak of COVID-19. When should I get emergency medical care?  Get help right away by calling your local emergency services (911 in the [place] if you have: ? Trouble breathing. ? Pain or pressure in your  chest. ? Confusion. ? Blue-tinged lips and fingernails. ? Difficulty waking from sleep. ? Symptoms that get worse. Let the emergency medical personnel know if you think you have coronavirus disease. Summary  A new respiratory virus is spreading from person to person and causing COVID-19 (coronavirus disease).  The virus that causes COVID-19 appears to spread easily. It spreads from one person to another through droplets  from breathing, speaking, singing, coughing, or sneezing.  Older adults and those with chronic diseases are at higher risk of disease. If you are at higher risk for complications, take extra precautions.  There is currently no vaccine to prevent coronavirus disease. There are no medicines, such as antibiotics or antivirals, to treat the virus.  You can protect yourself and your family by washing your hands often, avoiding touching your face, and covering your coughs and sneezes. This information is not intended to replace advice given to you by your health care provider. Make sure you discuss any questions you have with your health care provider. Document Revised: 09/25/2019 Document Reviewed: 03/24/2019 Elsevier Patient Education  2020 Elsevier Inc.  COVID-19: Quarantine vs. Isolation QUARANTINE keeps someone who was in close contact with someone who has COVID-19 away from others. If you had close contact with a person who has COVID-19  Stay home until 14 days after your last contact.  Check your temperature twice a day and watch for symptoms of COVID-19.  If possible, stay away from people who are at higher-risk for getting very sick from COVID-19. ISOLATION keeps someone who is sick or tested positive for COVID-19 without symptoms away from others, even in their own home. If you are sick and think or know you have COVID-19  Stay home until after ? At least 10 days since symptoms first appeared and ? At least 24 hours with no fever without fever-reducing  medication and ? Symptoms have improved If you tested positive for COVID-19 but do not have symptoms  Stay home until after ? 10 days have passed since your positive test If you live with others, stay in a specific "sick room" or area and away from other people or animals, including pets. Use a separate bathroom, if available. cdc.gov/coronavirus 06/29/2019 This information is not intended to replace advice given to you by your health care provider. Make sure you discuss any questions you have with your health care provider. Document Revised: 11/12/2019 Document Reviewed: 11/12/2019 Elsevier Patient Education  2020 Elsevier Inc.  

## 2020-03-28 NOTE — TOC Progression Note (Signed)
Transition of Care [facility]) - Progression Note    Patient Details  Name: David Garrett MRN: [id number] Date of Birth: [birth year]  [facility]) CM/SW Contact  Geni Bers, RN Phone Number: 03/28/2020, 3:16 PM  Clinical Narrative:    Pt plan to discharge home at discharge will continue to follow for [facility] needs.    Expected Discharge Plan: Home/Self Care Barriers to Discharge: No Barriers Identified  Expected Discharge Plan and Services Expected Discharge Plan: Home/Self Care       Living arrangements for the past 2 months: Single Family Home Expected Discharge Date: 03/28/20                                     Social Determinants of Health (SDOH) Interventions    Readmission Risk Interventions No flowsheet data found.

## 2020-03-28 NOTE — Progress Notes (Signed)
SATURATION QUALIFICATIONS: (This note is used to comply with regulatory documentation for home oxygen)  Patient Saturations on Room Air at Rest =97%  Patient Saturations on Room Air while Ambulating = 92%  Patient Saturations on 0 Liters of oxygen while Ambulating = 92%  Please briefly explain why patient needs home oxygen: Patient does not require home oxygen.

## 2020-04-28 ENCOUNTER — Ambulatory Visit
Admission: RE | Admit: 2020-04-28 | Discharge: 2020-04-28 | Disposition: A | Discharge status: Home / Self Care | Payer: Self-pay | Source: Ambulatory Visit | Attending: Family Medicine | Admitting: Family Medicine

## 2020-04-28 ENCOUNTER — Other Ambulatory Visit: Payer: Self-pay | Admitting: Family Medicine

## 2020-04-28 ENCOUNTER — Other Ambulatory Visit: Payer: Self-pay

## 2020-04-28 DIAGNOSIS — J1282 Pneumonia due to coronavirus disease 2019: Secondary | ICD-10-CM

## 2020-04-28 DIAGNOSIS — U071 COVID-19: Secondary | ICD-10-CM

## 2020-08-31 IMAGING — CT CT ANGIO CHEST
2 of 6 series · 19 of 46 positions shown · IV contrast (OMNIPAQUE)
Comparison: Chest radiographs, most recent 03/23/20

CLINICAL DATA: Shortness of breath. W1DCL-KX positive.

EXAM:
CT ANGIOGRAPHY CHEST WITH CONTRAST
TECHNIQUE: Multidetector CT imaging of the chest was performed using the
standard protocol during bolus administration of intravenous
contrast. Multiplanar CT image reconstructions and MIPs were
obtained to evaluate the vascular anatomy.
CONTRAST:  100mL OMNIPAQUE IOHEXOL 350 MG/ML SOLN

[Series 5: thins · axial · 0.61mm/px · z∈[+1390,+1676]mm · 17 of 314 slices shown]
[im 14/314  lung]
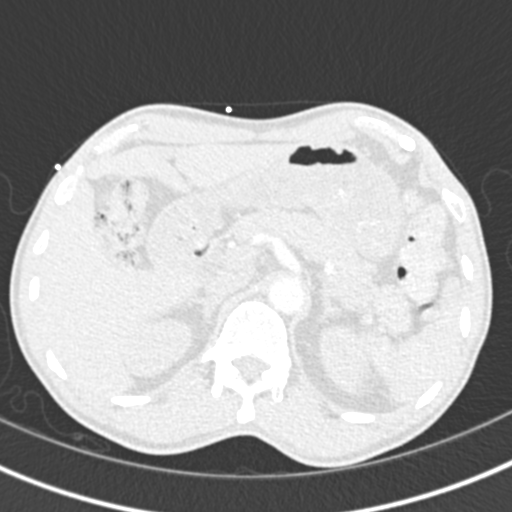
[im 28/314  soft-tissue]
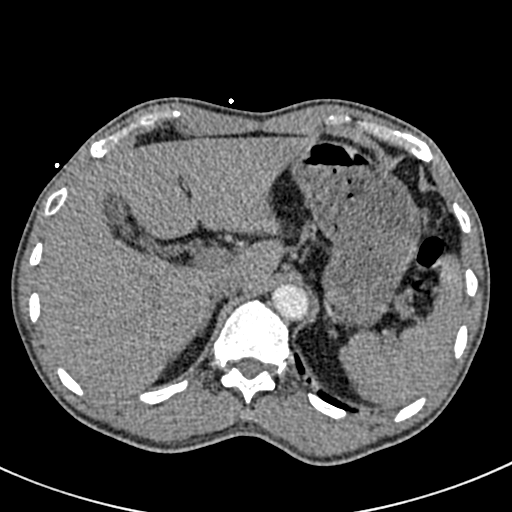
[im 55/314  lung]
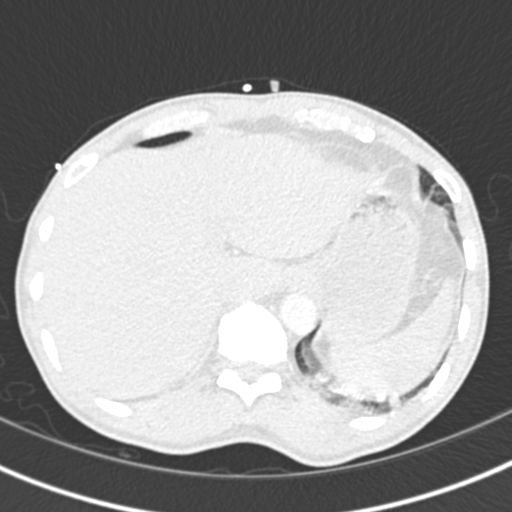
[im 69/314  soft-tissue]
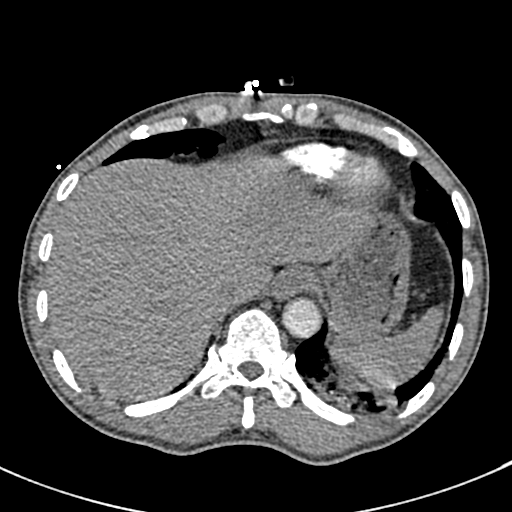
[im 82/314  lung]
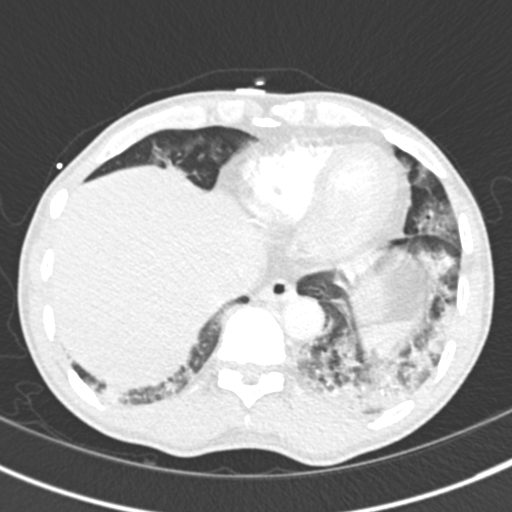
[im 109/314  soft-tissue]
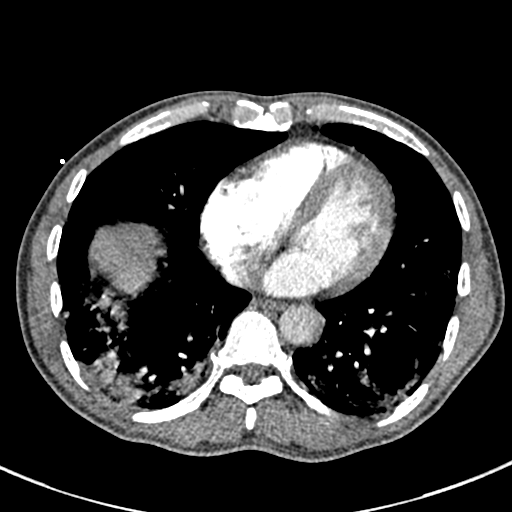
[im 123/314  lung]
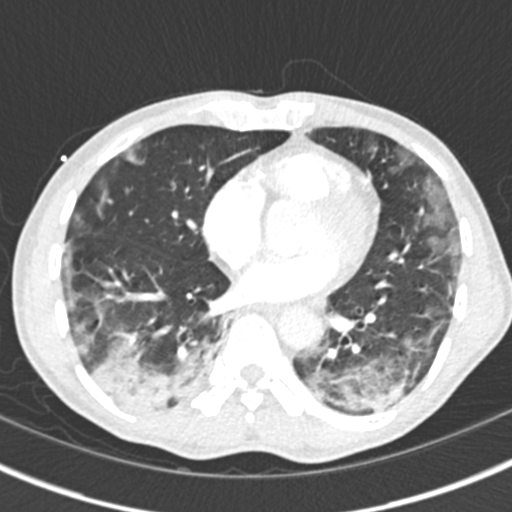
[im 137/314  soft-tissue]
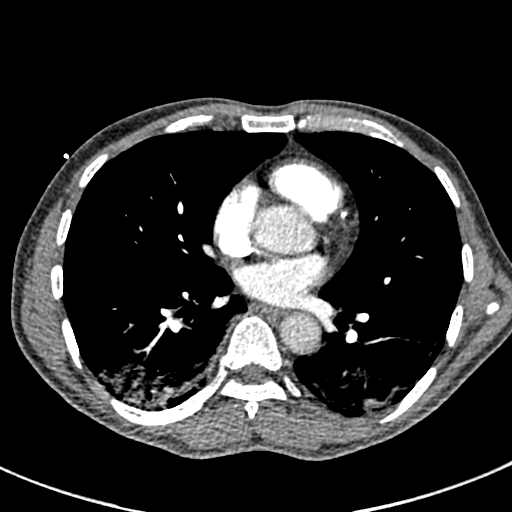
[im 164/314  lung]
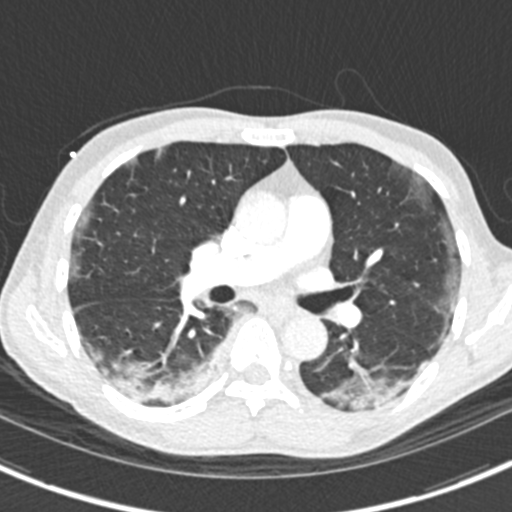
[im 177/314  soft-tissue]
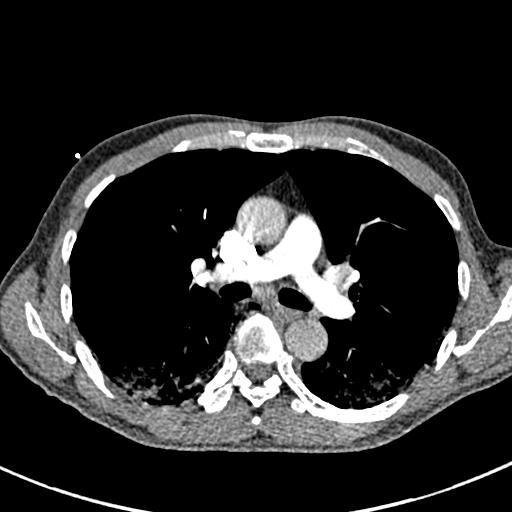
[im 191/314  lung]
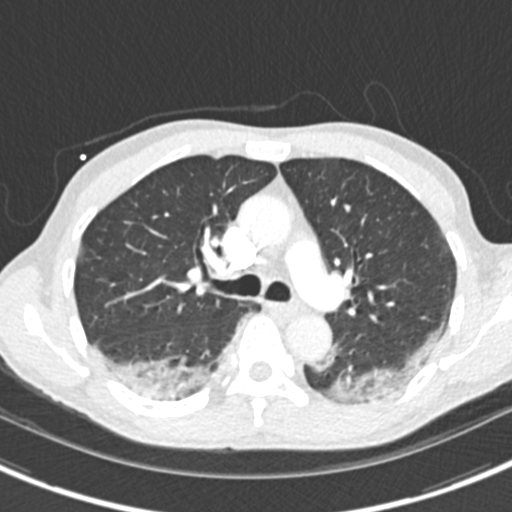
[im 205/314  soft-tissue]
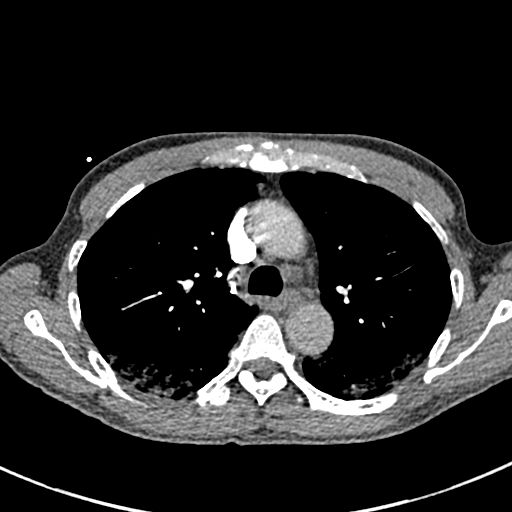
[im 232/314  lung]
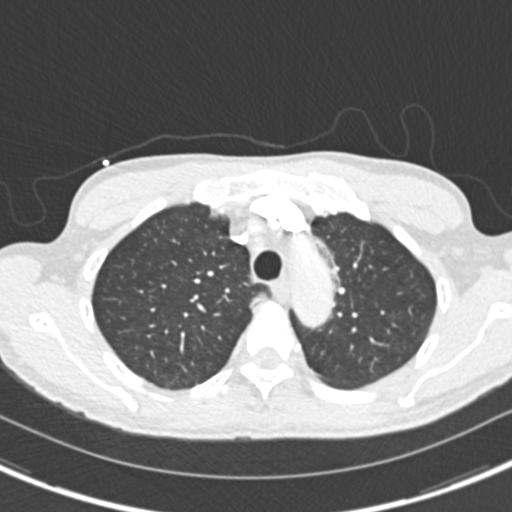
[im 245/314  soft-tissue]
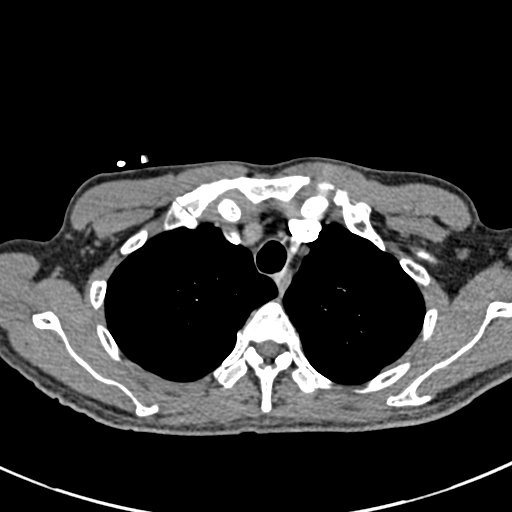
[im 259/314  lung]
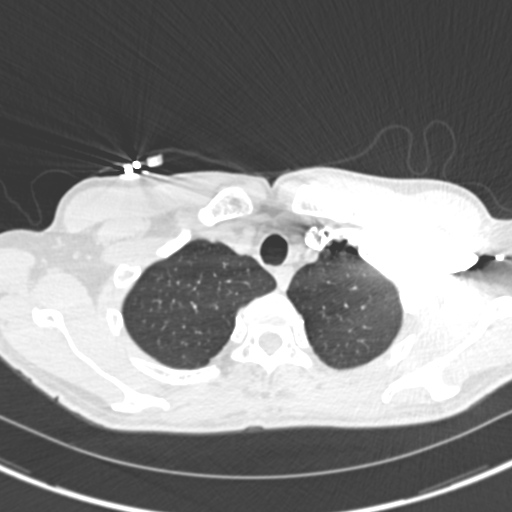
[im 286/314  soft-tissue]
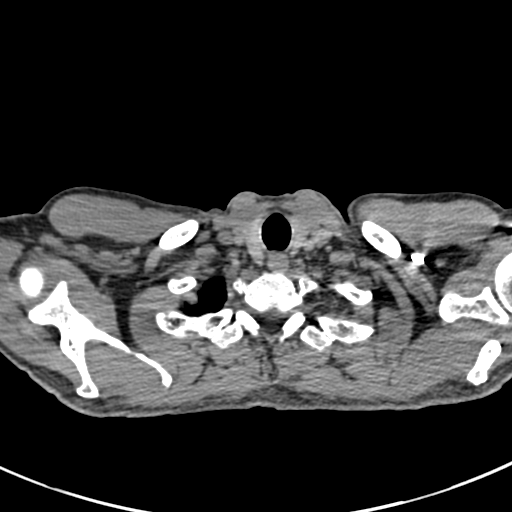
[im 300/314  lung]
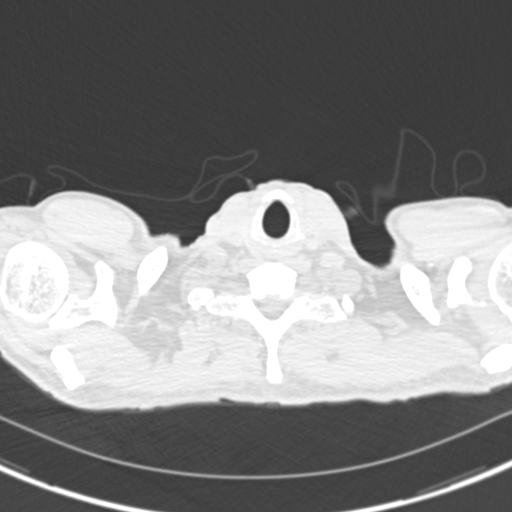

[Series 7: coronal mpr · coronal · 0.61mm/px · 2 of 81 slices shown]
[im 27/81  soft-tissue]
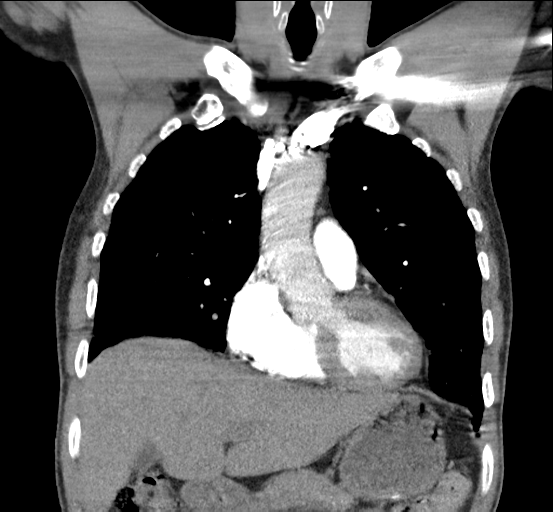
[im 54/81  soft-tissue]
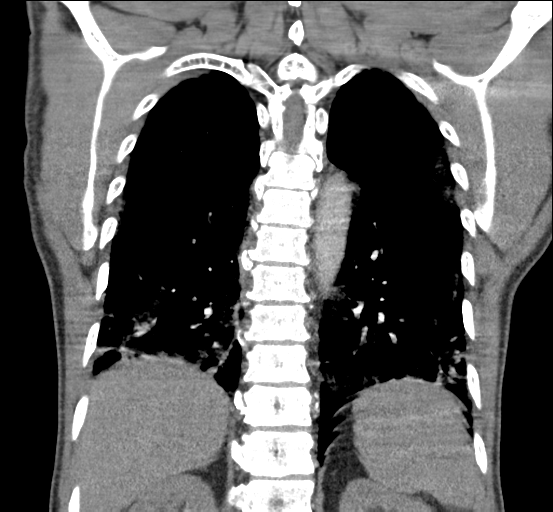

[19 of 46 positions shown; findings below may reference images not displayed]

FINDINGS: Cardiovascular: The quality of this exam for evaluation of pulmonary
embolism is good. The only limitation is minimal motion degradation
inferiorly. No pulmonary embolism to the segmental level.

Aortic atherosclerosis. Normal heart size, without pericardial
effusion. Multivessel coronary artery atherosclerosis.

Mediastinum/Nodes: No mediastinal or hilar adenopathy.

Lungs/Pleura: No pleural fluid. Typical findings of W1DCL-KX
pneumonia, with basilar and peripheral predominant bilateral
airspace and ground-glass opacities.

Upper Abdomen: Normal imaged portions of the liver, spleen, stomach,
pancreas, adrenal glands, kidneys, gallbladder. Colonic stool burden
suggests constipation.

Musculoskeletal: No acute osseous abnormality.

Review of the MIP images confirms the above findings.
IMPRESSION: 1. No evidence of pulmonary embolism. Mild limitations secondary to
motion inferiorly.
2. W1DCL-KX pneumonia.
3. Possible constipation.
4. Coronary artery atherosclerosis. Aortic Atherosclerosis
([id number]).

## 2022-10-08 ENCOUNTER — Emergency Department (HOSPITAL_BASED_OUTPATIENT_CLINIC_OR_DEPARTMENT_OTHER)
Admission: EM | Admit: 2022-10-08 | Discharge: 2022-10-08 | Disposition: A | Discharge status: Home / Self Care | Attending: Emergency Medicine | Admitting: Emergency Medicine

## 2022-10-08 ENCOUNTER — Other Ambulatory Visit: Payer: Self-pay

## 2022-10-08 ENCOUNTER — Emergency Department (HOSPITAL_BASED_OUTPATIENT_CLINIC_OR_DEPARTMENT_OTHER)

## 2022-10-08 ENCOUNTER — Encounter (HOSPITAL_BASED_OUTPATIENT_CLINIC_OR_DEPARTMENT_OTHER): Payer: Self-pay | Admitting: Emergency Medicine

## 2022-10-08 DIAGNOSIS — Y990 Civilian activity done for income or pay: Secondary | ICD-10-CM | POA: Insufficient documentation

## 2022-10-08 DIAGNOSIS — W19XXXA Unspecified fall, initial encounter: Secondary | ICD-10-CM

## 2022-10-08 DIAGNOSIS — S0100XA Unspecified open wound of scalp, initial encounter: Secondary | ICD-10-CM | POA: Diagnosis not present

## 2022-10-08 DIAGNOSIS — S0990XA Unspecified injury of head, initial encounter: Secondary | ICD-10-CM | POA: Diagnosis present

## 2022-10-08 DIAGNOSIS — W01198A Fall on same level from slipping, tripping and stumbling with subsequent striking against other object, initial encounter: Secondary | ICD-10-CM | POA: Diagnosis not present

## 2022-10-08 DIAGNOSIS — S0003XA Contusion of scalp, initial encounter: Secondary | ICD-10-CM

## 2022-10-08 NOTE — ED Notes (Signed)
Pt given discharge instructions. Opportunities given for questions. Pt verbalizes understanding. Chou Busler R, RN 

## 2022-10-08 NOTE — ED Provider Notes (Signed)
[facility] EMERGENCY DEPT Provider Note   CSN: [id number] Arrival date & time: 10/08/22  1053     History  Chief Complaint  Patient presents with   Head Injury    David Garrett is a 61 y.o. male.  61 year old male presents for evaluation after a slip and fall at work last night.  Patient states that he felt on his back, hitting the back of his head.  No loss of consciousness, is not anticoagulated.  Patient was able to stand independently after the incident, was ambulatory without difficulty.  Reports mild headache.  Patient had a minor wound to the posterior scalp yesterday, family became concerned today when the area was swollen again.  Otherwise is acting normal, no visual disturbance, no vomiting.  No other injuries, complaints, concerns.       Home Medications Prior to Admission medications   Medication Sig Start Date End Date Taking? Authorizing Provider  acetaminophen (TYLENOL) 500 MG tablet Take 1,000 mg by mouth every 6 (six) hours as needed for mild pain.    [provider]  chlorpheniramine-HYDROcodone (TUSSIONEX) 10-8 MG/5ML SUER Take 5 mLs by mouth every 12 (twelve) hours as needed for cough. 03/28/20   Jae Dire, MD  Ipratropium-Albuterol (COMBIVENT) 20-100 MCG/ACT AERS respimat Inhale 1 puff into the lungs every 6 (six) hours. 03/28/20   Jae Dire, MD      Allergies    Patient has no known allergies.    Review of Systems   Review of Systems Negative except as per HPI Physical Exam Updated Vital Signs BP (!) 158/79   Pulse 74   Temp 98.1 F (36.7 C)   Resp 16   Ht 5\' 8"  (1.727 m)   Wt 63.5 kg   SpO2 99%   BMI 21.29 kg/m  Physical Exam Vitals and nursing note reviewed.  Constitutional:      General: He is not in acute distress.    Appearance: He is well-developed. He is not diaphoretic.     Comments: Hematoma to posterior scalp, small wound which is no longer bleeding  HENT:     Head: Normocephalic.     Right Ear:  Tympanic membrane and ear canal normal.     Left Ear: Tympanic membrane and ear canal normal.     Nose: Nose normal.     Mouth/Throat:     Mouth: Mucous membranes are moist.  Eyes:     Extraocular Movements: Extraocular movements intact.     Pupils: Pupils are equal, round, and reactive to light.  Pulmonary:     Effort: Pulmonary effort is normal.  Musculoskeletal:     Cervical back: Normal range of motion and neck supple.  Skin:    General: Skin is warm and dry.     Findings: No erythema or rash.  Neurological:     Mental Status: He is alert and oriented to person, place, and time.  Psychiatric:        Behavior: Behavior normal.     ED Results / Procedures / Treatments   Labs (all labs ordered are listed, but only abnormal results are displayed) Labs Reviewed - No data to display  EKG None  Radiology CT Head Wo Contrast  Result Date: 10/08/2022 CLINICAL DATA:  Head and neck injury after fall last night. Traumatic headache. EXAM: CT HEAD WITHOUT CONTRAST CT CERVICAL SPINE WITHOUT CONTRAST TECHNIQUE: Multidetector CT imaging of the head and cervical spine was performed following the standard protocol without intravenous contrast. Multiplanar CT  image reconstructions of the cervical spine were also generated. RADIATION DOSE REDUCTION: This exam was performed according to the departmental dose-optimization program which includes automated exposure control, adjustment of the mA and/or kV according to patient size and/or use of iterative reconstruction technique. COMPARISON:  None Available. FINDINGS: CT HEAD FINDINGS Brain: No evidence of acute infarction, hemorrhage, hydrocephalus, extra-axial collection or mass lesion/mass effect. Vascular: No hyperdense vessel or unexpected calcification. Skull: Normal. Negative for fracture or focal lesion. Sinuses/Orbits: Bilateral ethmoid sinusitis is noted. Other: Small posterior scalp hematoma is noted. CT CERVICAL SPINE FINDINGS Alignment:  Normal. Skull base and vertebrae: No acute fracture. No primary bone lesion or focal pathologic process. Soft tissues and spinal canal: No prevertebral fluid or swelling. No visible canal hematoma. Disc levels: Moderate degenerative disc disease is noted at C5-6 and C6-7. Upper chest: Negative. Other: None. IMPRESSION: Small posterior scalp hematoma. Bilateral ethmoid sinusitis. No acute intracranial abnormality seen. 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 No acute abnormality is noted. Electronically Signed   By: Lupita Raider M.D.   On: 10/08/2022 12:37   CT Cervical Spine Wo Contrast  Result Date: 10/08/2022 CLINICAL DATA:  Head and neck injury after fall last night. Traumatic headache. EXAM: CT HEAD WITHOUT CONTRAST CT CERVICAL SPINE WITHOUT CONTRAST TECHNIQUE: Multidetector CT imaging of the head and cervical spine was performed following the standard protocol without intravenous contrast. Multiplanar CT image reconstructions of the cervical spine were also generated. RADIATION DOSE REDUCTION: This exam was performed according to the departmental dose-optimization program which includes automated exposure control, adjustment of the mA and/or kV according to patient size and/or use of iterative reconstruction technique. COMPARISON:  None Available. FINDINGS: CT HEAD FINDINGS Brain: No evidence of acute infarction, hemorrhage, hydrocephalus, extra-axial collection or mass lesion/mass effect. Vascular: No hyperdense vessel or unexpected calcification. Skull: Normal. Negative for fracture or focal lesion. Sinuses/Orbits: Bilateral ethmoid sinusitis is noted. Other: Small posterior scalp hematoma is noted. CT CERVICAL SPINE FINDINGS Alignment: Normal. Skull base and vertebrae: No acute fracture. No primary bone lesion or focal pathologic process. Soft tissues and spinal canal: No prevertebral fluid or swelling. No visible canal hematoma. Disc levels: Moderate  degenerative disc disease is noted at C5-6 and C6-7. Upper chest: Negative. Other: None. IMPRESSION: Small posterior scalp hematoma. Bilateral ethmoid sinusitis. No acute intracranial abnormality seen. 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 No acute abnormality is noted. Electronically Signed   By: Lupita Raider M.D.   On: 10/08/2022 12:37    Procedures Procedures    Medications Ordered in ED Medications - No data to display  ED Course/ Medical Decision Making/ A&P                           Medical Decision Making Amount and/or Complexity of Data Reviewed Radiology: ordered.   61 year old male presents for evaluation after a fall hitting the back of his head which occurred last night.  No loss of consciousness, has been acting normal since, no vomiting, ambulatory without difficulty, not anticoagulated.  He is found to have a small hematoma to the posterior scalp, no crepitus, no battle sign or raccoon eyes.  Does have a minor wound in this hematoma which is not bleeding, last tetanus unknown but patient prefers to confirm with his PCP prior to update today.  CT of the head shows a scalp hematoma, no skull fracture, no other acute abnormality.  A CT of the C-spine is without acute injury.  Recommend patient recheck with his Worker's Comp. provider in the next 2 days with return to ER precautions provided.        Final Clinical Impression(s) / ED Diagnoses Final diagnoses:  Fall, initial encounter  Hematoma of scalp, initial encounter    Rx / DC Orders ED Discharge Orders     None         Tacy Learn, PA-C 10/08/22 1404    Tegeler, Gwenyth Allegra, MD 10/08/22 [phone]

## 2022-10-08 NOTE — Discharge Instructions (Addendum)
Recheck with your Worker's Comp. provider in 2 days.  Can take Tylenol as needed as directed for any headache.  Call your doctors office to confirm your last tetanus vaccine.  If it has been longer than 5 years, recommend updated vaccine to your doctor's office.

## 2022-10-08 NOTE — ED Notes (Signed)
Pt c/o no dizziness, blurred vision, or nausea. A/ox4, VSS.

## 2022-10-08 NOTE — ED Triage Notes (Signed)
Pt arrives to ED with c/o head injury. He reports last night at 8pm he slipped at work and hit the posterior part of his head. He denies LOC. He reports headache that is left sided. He does reports neck pain. Associated symptoms include dizziness.

## 2023-02-28 DIAGNOSIS — M79604 Pain in right leg: Secondary | ICD-10-CM | POA: Diagnosis not present

## 2023-02-28 DIAGNOSIS — Z9181 History of falling: Secondary | ICD-10-CM | POA: Diagnosis not present

## 2023-02-28 DIAGNOSIS — M79605 Pain in left leg: Secondary | ICD-10-CM | POA: Diagnosis not present

## 2023-02-28 DIAGNOSIS — R0781 Pleurodynia: Secondary | ICD-10-CM | POA: Diagnosis not present

## 2023-03-01 ENCOUNTER — Other Ambulatory Visit: Payer: Self-pay | Admitting: Family Medicine

## 2023-03-01 ENCOUNTER — Ambulatory Visit
Admission: RE | Admit: 2023-03-01 | Discharge: 2023-03-01 | Disposition: A | Discharge status: Home / Self Care | Payer: 59 | Source: Ambulatory Visit | Attending: Family Medicine | Admitting: Family Medicine

## 2023-03-01 DIAGNOSIS — R0781 Pleurodynia: Secondary | ICD-10-CM

## 2023-03-01 DIAGNOSIS — Z9181 History of falling: Secondary | ICD-10-CM

## 2023-06-07 DIAGNOSIS — Z79899 Other long term (current) drug therapy: Secondary | ICD-10-CM | POA: Diagnosis not present

## 2023-06-07 DIAGNOSIS — N400 Enlarged prostate without lower urinary tract symptoms: Secondary | ICD-10-CM | POA: Diagnosis not present

## 2023-06-07 DIAGNOSIS — E7800 Pure hypercholesterolemia, unspecified: Secondary | ICD-10-CM | POA: Diagnosis not present

## 2023-06-07 DIAGNOSIS — E1165 Type 2 diabetes mellitus with hyperglycemia: Secondary | ICD-10-CM | POA: Diagnosis not present

## 2023-06-07 DIAGNOSIS — M79602 Pain in left arm: Secondary | ICD-10-CM | POA: Diagnosis not present

## 2023-06-07 DIAGNOSIS — D7589 Other specified diseases of blood and blood-forming organs: Secondary | ICD-10-CM | POA: Diagnosis not present

## 2023-06-07 DIAGNOSIS — H61899 Other specified disorders of external ear, unspecified ear: Secondary | ICD-10-CM | POA: Diagnosis not present

## 2023-06-07 DIAGNOSIS — Z0000 Encounter for general adult medical examination without abnormal findings: Secondary | ICD-10-CM | POA: Diagnosis not present

## 2023-06-14 ENCOUNTER — Other Ambulatory Visit: Payer: Self-pay | Admitting: Family Medicine

## 2023-06-14 ENCOUNTER — Ambulatory Visit
Admission: RE | Admit: 2023-06-14 | Discharge: 2023-06-14 | Disposition: A | Discharge status: Home / Self Care | Payer: 59 | Source: Ambulatory Visit | Attending: Family Medicine | Admitting: Family Medicine

## 2023-06-14 DIAGNOSIS — M25512 Pain in left shoulder: Secondary | ICD-10-CM | POA: Diagnosis not present

## 2023-06-14 DIAGNOSIS — M79602 Pain in left arm: Secondary | ICD-10-CM

## 2023-06-14 DIAGNOSIS — M19012 Primary osteoarthritis, left shoulder: Secondary | ICD-10-CM | POA: Diagnosis not present

## 2023-06-14 DIAGNOSIS — M5412 Radiculopathy, cervical region: Secondary | ICD-10-CM | POA: Diagnosis not present

## 2023-07-02 DIAGNOSIS — M5412 Radiculopathy, cervical region: Secondary | ICD-10-CM | POA: Diagnosis not present

## 2023-07-02 DIAGNOSIS — M79645 Pain in left finger(s): Secondary | ICD-10-CM | POA: Diagnosis not present

## 2023-11-19 DIAGNOSIS — J988 Other specified respiratory disorders: Secondary | ICD-10-CM | POA: Diagnosis not present

## 2023-11-19 DIAGNOSIS — R059 Cough, unspecified: Secondary | ICD-10-CM | POA: Diagnosis not present

## 2023-11-19 DIAGNOSIS — Z03818 Encounter for observation for suspected exposure to other biological agents ruled out: Secondary | ICD-10-CM | POA: Diagnosis not present
